# Patient Record
Sex: Female | Born: 1950 | Race: White | Hispanic: No | Smoking: Current every day smoker
Health system: Southern US, Community
[De-identification: ages and names within clinical notes are randomized; demographics above are authoritative.]

## PROBLEM LIST (undated history)

## (undated) DIAGNOSIS — R7303 Prediabetes: Secondary | ICD-10-CM

## (undated) DIAGNOSIS — I10 Essential (primary) hypertension: Secondary | ICD-10-CM

## (undated) DIAGNOSIS — J449 Chronic obstructive pulmonary disease, unspecified: Secondary | ICD-10-CM

## (undated) DIAGNOSIS — E669 Obesity, unspecified: Secondary | ICD-10-CM

## (undated) DIAGNOSIS — K2270 Barrett's esophagus without dysplasia: Secondary | ICD-10-CM

## (undated) DIAGNOSIS — F17200 Nicotine dependence, unspecified, uncomplicated: Secondary | ICD-10-CM

## (undated) DIAGNOSIS — K219 Gastro-esophageal reflux disease without esophagitis: Secondary | ICD-10-CM

## (undated) HISTORY — PX: OOPHORECTOMY: SHX86

## (undated) HISTORY — DX: Obesity, unspecified: E66.9

## (undated) HISTORY — DX: Gastro-esophageal reflux disease without esophagitis: K21.9

## (undated) HISTORY — PX: LAPAROSCOPY: SHX197

## (undated) HISTORY — DX: Prediabetes: R73.03

## (undated) HISTORY — PX: ABDOMINAL HYSTERECTOMY: SHX81

## (undated) HISTORY — DX: Essential (primary) hypertension: I10

## (undated) HISTORY — PX: CHOLECYSTECTOMY: SHX55

## (undated) HISTORY — DX: Chronic obstructive pulmonary disease, unspecified: J44.9

## (undated) HISTORY — DX: Nicotine dependence, unspecified, uncomplicated: F17.200

## (undated) HISTORY — DX: Barrett's esophagus without dysplasia: K22.70

---

## 1983-04-12 HISTORY — PX: APPENDECTOMY: SHX54

## 2004-11-08 ENCOUNTER — Emergency Department: Payer: Self-pay | Admitting: Emergency Medicine

## 2005-02-25 ENCOUNTER — Ambulatory Visit: Payer: Self-pay | Admitting: Gastroenterology

## 2006-01-02 ENCOUNTER — Observation Stay: Payer: Self-pay | Admitting: Internal Medicine

## 2006-03-09 ENCOUNTER — Ambulatory Visit: Payer: Self-pay | Admitting: Gastroenterology

## 2007-08-14 ENCOUNTER — Ambulatory Visit: Payer: Self-pay | Admitting: Family Medicine

## 2007-09-04 ENCOUNTER — Ambulatory Visit: Payer: Self-pay | Admitting: Unknown Physician Specialty

## 2008-03-10 ENCOUNTER — Ambulatory Visit: Payer: Self-pay

## 2008-03-10 ENCOUNTER — Encounter: Payer: Self-pay | Admitting: Internal Medicine

## 2008-05-23 ENCOUNTER — Ambulatory Visit: Payer: Self-pay | Admitting: Family Medicine

## 2008-05-30 ENCOUNTER — Ambulatory Visit: Payer: Self-pay | Admitting: Family Medicine

## 2008-06-10 ENCOUNTER — Ambulatory Visit: Payer: Self-pay | Admitting: Family Medicine

## 2009-08-07 ENCOUNTER — Encounter: Payer: Self-pay | Admitting: Internal Medicine

## 2009-08-07 ENCOUNTER — Ambulatory Visit: Payer: Self-pay | Admitting: Cardiology

## 2009-08-07 ENCOUNTER — Ambulatory Visit: Payer: Self-pay | Admitting: Family Medicine

## 2009-08-07 ENCOUNTER — Observation Stay (HOSPITAL_COMMUNITY): Admission: EM | Admit: 2009-08-07 | Discharge: 2009-08-08 | Payer: Self-pay | Admitting: Emergency Medicine

## 2009-08-11 ENCOUNTER — Encounter: Payer: Self-pay | Admitting: Internal Medicine

## 2009-08-11 ENCOUNTER — Ambulatory Visit: Payer: Self-pay | Admitting: Family Medicine

## 2009-08-13 ENCOUNTER — Encounter: Payer: Self-pay | Admitting: Cardiology

## 2009-08-19 ENCOUNTER — Telehealth (INDEPENDENT_AMBULATORY_CARE_PROVIDER_SITE_OTHER): Payer: Self-pay | Admitting: *Deleted

## 2009-08-24 DIAGNOSIS — E669 Obesity, unspecified: Secondary | ICD-10-CM

## 2009-08-24 DIAGNOSIS — I10 Essential (primary) hypertension: Secondary | ICD-10-CM | POA: Insufficient documentation

## 2009-09-02 ENCOUNTER — Encounter: Payer: Self-pay | Admitting: Cardiology

## 2009-09-02 DIAGNOSIS — R079 Chest pain, unspecified: Secondary | ICD-10-CM

## 2009-09-02 DIAGNOSIS — F17200 Nicotine dependence, unspecified, uncomplicated: Secondary | ICD-10-CM

## 2009-09-02 DIAGNOSIS — J449 Chronic obstructive pulmonary disease, unspecified: Secondary | ICD-10-CM

## 2009-09-02 DIAGNOSIS — J4489 Other specified chronic obstructive pulmonary disease: Secondary | ICD-10-CM | POA: Insufficient documentation

## 2009-09-02 DIAGNOSIS — K219 Gastro-esophageal reflux disease without esophagitis: Secondary | ICD-10-CM | POA: Insufficient documentation

## 2009-09-03 ENCOUNTER — Ambulatory Visit: Payer: Self-pay | Admitting: Cardiology

## 2009-09-29 ENCOUNTER — Ambulatory Visit: Payer: Self-pay | Admitting: Family Medicine

## 2009-09-29 ENCOUNTER — Encounter: Payer: Self-pay | Admitting: Internal Medicine

## 2009-09-29 LAB — CONVERTED CEMR LAB
ALT: 13 units/L
Albumin: 4.1 g/dL
BUN: 15 mg/dL
Hemoglobin: 13.1 g/dL
MCV: 78 fL
Platelets: 332 10*3/uL
TSH: 1.958 microintl units/mL

## 2009-10-07 ENCOUNTER — Telehealth: Payer: Self-pay | Admitting: Cardiology

## 2009-10-08 ENCOUNTER — Ambulatory Visit: Payer: Self-pay | Admitting: Internal Medicine

## 2009-10-08 ENCOUNTER — Encounter (INDEPENDENT_AMBULATORY_CARE_PROVIDER_SITE_OTHER): Payer: Self-pay | Admitting: *Deleted

## 2009-10-08 DIAGNOSIS — Z860100 Personal history of colon polyps, unspecified: Secondary | ICD-10-CM | POA: Insufficient documentation

## 2009-10-08 DIAGNOSIS — K2270 Barrett's esophagus without dysplasia: Secondary | ICD-10-CM

## 2009-10-08 DIAGNOSIS — R1319 Other dysphagia: Secondary | ICD-10-CM

## 2009-11-10 ENCOUNTER — Ambulatory Visit: Payer: Self-pay | Admitting: Internal Medicine

## 2010-05-11 NOTE — Letter (Signed)
Summary: [employer] Authorization   [employer] Authorization   Imported By: Roderic Ovens 09/18/2009 12:10:22  _____________________________________________________________________  External Attachment:    Type:   Image     Comment:   External Document

## 2010-05-11 NOTE — Assessment & Plan Note (Signed)
Summary: eph/jss   Visit Type:  post hospital Primary Provider:  Standley Dakins, MD  CC:  chest discomfort.  History of Present Illness: The patient is seen post hospitalization.  She was admitted August 07, 2009 and discharged August 08, 2009.  She had some chest discomfort.  There were some atypical qualities.  There was no myocardial infarction.  It was felt that she was stable for discharge with outpatient followup.  Since then she's done well.  Nexium appears to help her symptoms the most.  She continues to smoke but she is cutting back.  She has not had any recurrent significant chest discomfort.  Current Medications (verified): 1)  Aspirin 81 Mg Tbec (Aspirin) .... Take One Tablet By Mouth Daily 2)  Hydrochlorothiazide 25 Mg Tabs (Hydrochlorothiazide) .... Take One Tablet By Mouth Daily. 3)  Potassium Chloride Crys Cr 20 Meq Cr-Tabs (Potassium Chloride Crys Cr) .... Take One Tablet By Mouth Daily 4)  Advair Diskus 250-50 Mcg/dose Aepb (Fluticasone-Salmeterol) .... Once Daily 5)  Nexium 40 Mg Cpdr (Esomeprazole Magnesium) .... Two Times A Day 6)  Singulair 10 Mg Tabs (Montelukast Sodium) .... Once Daily 7)  Symbyax 3-25 Mg Caps (Olanzapine-Fluoxetine Hcl) .... Once Daily  Allergies (verified): 1)  ! Codeine  Past History:  Past Medical History: Last updated: 09/02/2009 HYPERTENSION, OVERWEIGHT/OBESITY COPD / asthma GERD / Barrett esophagus Depression Chest Pain   [facility]..08/07/2009.Marland Kitchenno ACS.Marland Kitchento have outpt. f/u Tobacco abuse     Review of Systems       Patient denies fever, chills, headache, sweats, rash, change in vision, change in hearing, chest pain, cough, nausea vomiting, urinary symptoms.  All of the systems are reviewed and are negative.  Vital Signs:  Patient profile:   60 year old female Height:      63 inches Weight:      216 pounds BMI:     38.40 Pulse rate:   88 / minute Pulse rhythm:   regular BP sitting:   124 / 78  (left arm)  Vitals Entered By:  Jacquelin Hawking, CMA (Sep 03, 2009 8:58 AM)  Physical Exam  General:  patient is stable today.  She is overweight. Head:  head is atraumatic. Eyes:  no xanthelasma. Neck:  no jugular venous distention. Chest Wall:  no chest wall tenderness. Lungs:  lungs reveal scattered rhonchi.  She does have an intermittent cough. Heart:  cardiac exam reveals S1-S2.  No clicks or significant murmurs. Abdomen:  abdomen is soft.  No masses or bruits. Msk:  no musculoskeletal deformities. Extremities:  no peripheral edema. Skin:  no skin rashes. Psych:  patient is oriented to person time and place.  Affect is normal.   Impression & Recommendations:  Problem # 1:  TOBACCO ABUSE (ICD-305.1) I had a long discussion with the patient about her smoking and encouraged her to stop.  She is cutting back.  She no longer smokes in her house and she tries not to smoke at all at work.  She is continuing to improve in this area.  Problem # 2:  CHEST PAIN (ICD-786.50)  Her updated medication list for this problem includes:    Aspirin 81 Mg Tbec (Aspirin) .Marland Kitchen... Take one tablet by mouth daily The patient has not had any recurring significant chest discomfort.  I have carefully reviewed her overall status.  I feel that we do not need to push for cardiac testing at this time.  She is definitely improved with treatment with Nexium.  EKG is done today and reviewed  by me.  It is normal.  I decided to plan to see her back on one other occasion.  If she has recurring symptoms we will be more aggressive with the evaluation.  Problem # 3:  COPD (ICD-496) The patient has a cough while in the room that is intermittent.  She is aware that stopping smoking is the key to her improvement.  Problem # 4:  HYPERTENSION, UNSPECIFIED (ICD-401.9) blood pressure is controlled today.  No change in therapy.  Other Orders: EKG w/ Interpretation (93000)  Patient Instructions: 1)  Follow up in 3 months

## 2010-05-11 NOTE — Letter (Signed)
Summary: EGD Instructions  [facility]  [address] [place], [place] [address]   Phone: [phone]  Fax: [phone]       Aimee Mcclure    07-Dec-1950    MRN: [id number]       Procedure Day Dorna Bloom: [name], 11/04/09     Arrival Time: 1:00 PM     Procedure Time: 2:00 PM     Location of Procedure:                    _X_ [facility] (4th Floor)  PREPARATION FOR ENDOSCOPY  On WEDNESDAY, 11/04/09 THE DAY OF THE PROCEDURE:  1.   No solid foods, milk or milk products are allowed after midnight the night before your procedure.  2.   Do not drink anything colored red or purple.  Avoid juices with pulp.  No orange juice.  3.  You may drink clear liquids until 12:00 PM, which is 2 hours before your procedure.                                                                                                CLEAR LIQUIDS INCLUDE: Water Jello Ice Popsicles Tea (sugar ok, no milk/cream) Powdered fruit flavored drinks Coffee (sugar ok, no milk/cream) Gatorade Juice: apple, white grape, white cranberry  Lemonade Clear bullion, consomm, broth Carbonated beverages (any kind) Strained chicken noodle soup Hard Candy   MEDICATION INSTRUCTIONS  Unless otherwise instructed, you should take regular prescription medications with a small sip of water as early as possible the morning of your procedure.                   OTHER INSTRUCTIONS  You will need a responsible adult at least 62 years of age to accompany you and drive you home.   This person must remain in the waiting room during your procedure.  Wear loose fitting clothing that is easily removed.  Leave jewelry and other valuables at home.  However, you may wish to bring a book to read or an iPod/MP3 player to listen to music as you wait for your procedure to start.  Remove all body piercing jewelry and leave at home.  Total time from sign-in until discharge is approximately 2-3  hours.  You should go home directly after your procedure and rest.  You can resume normal activities the day after your procedure.  The day of your procedure you should not:   Drive   Make legal decisions   Operate machinery   Drink alcohol   Return to work  You will receive specific instructions about eating, activities and medications before you leave.    The above instructions have been reviewed and explained to me by   [facility], CMA (AAMA)    I fully understand and can verbalize these instructions _____________________ Date October 08, 2009

## 2010-05-11 NOTE — Progress Notes (Signed)
  Recieved Attending Physicians Statement form [employer] sent to [facility]  Aug 19, 2009 9:59 AM

## 2010-05-11 NOTE — Miscellaneous (Signed)
  Clinical Lists Changes  Problems: Added new problem of COPD (ICD-496) Added new problem of GERD (ICD-530.81) Added new problem of * DEPRESSION Added new problem of CHEST PAIN (ICD-786.50) Added new problem of TOBACCO ABUSE (ICD-305.1) Observations: Added new observation of PAST MED HX: HYPERTENSION, OVERWEIGHT/OBESITY COPD / asthma GERD / Barrett esophagus Depression Chest Pain   [facility]..08/07/2009.Marland Kitchenno ACS.Marland Kitchento have outpt. f/u Tobacco abuse    (09/02/2009 7:55)       Past History:  Past Medical History: HYPERTENSION, OVERWEIGHT/OBESITY COPD / asthma GERD / Barrett esophagus Depression Chest Pain   [facility]..08/07/2009.Marland Kitchenno ACS.Marland Kitchento have outpt. f/u Tobacco abuse

## 2010-05-11 NOTE — Procedures (Signed)
Summary: Upper GI Endoscopy/[facility]  Upper GI Endoscopy/[facility]   Imported By: Sherian Rein 11/24/2009 15:21:21  _____________________________________________________________________  External Attachment:    Type:   Image     Comment:   External Document

## 2010-05-11 NOTE — Letter (Signed)
Summary: [facility]  [facility]   Imported By: Sherian Rein 11/23/2009 10:38:44  _____________________________________________________________________  External Attachment:    Type:   Image     Comment:   External Document

## 2010-05-11 NOTE — Letter (Signed)
Summary: [facility]  [facility]   Imported By: Sherian Rein 11/23/2009 10:37:39  _____________________________________________________________________  External Attachment:    Type:   Image     Comment:   External Document

## 2010-05-11 NOTE — Procedures (Signed)
Summary: Upper Endoscopy  Patient: Satoria Dunlop Note: All result statuses are Final unless otherwise noted.  Tests: (1) Upper Endoscopy (EGD)   EGD Upper Endoscopy       DONE     [facility]     [address]     [place], [place]  [address]           ENDOSCOPY PROCEDURE REPORT           PATIENT:  Ladeja, Pelham  MR#:  [id number]     BIRTHDATE:  [birth year], 59 yrs. old  GENDER:  female           ENDOSCOPIST:  Iva Boop, MD, [facility]     Referred by:  Standley Dakins, M.D.           PROCEDURE DATE:  11/10/2009     PROCEDURE:  EGD with biopsy, Maloney Dilation of Esophagus     ASA CLASS:  Class II     INDICATIONS:  dysphagia, h/o Barrett's Esophagus           MEDICATIONS:   Fentanyl 50 mcg IV, Versed 6 mg IV     TOPICAL ANESTHETIC:  Exactacain Spray           DESCRIPTION OF PROCEDURE:   After the risks benefits and     alternatives of the procedure were thoroughly explained, informed     consent was obtained.  The [facility] GIF-H180 [id number] endoscope was     introduced through the mouth and advanced to the second portion of     the duodenum, without limitations.  The instrument was slowly     withdrawn as the mucosa was fully examined.     <<PROCEDUREIMAGES>>           There were columnar mucosal changes consistent with Barrett's     esophagus identified at the gastroesophageal junction. Irregular     z-line vs. 2-3 tongues of columnar mucosa at GE junction (40 cm     from incisors). These possible tongues are not > 5 mm maximum     dimension. Multiple biopsies were obtained and sent to pathology.     Otherwise the examination was normal.    Retroflexed views revealed     no abnormalities.    The scope was then withdrawn from the patient     and 97 [place] Maloney dilator passed due to dysphagia.  No heme     seen.           COMPLICATIONS:  None           ENDOSCOPIC IMPRESSION:     1) Columnar mucosa, ? Barrett's at the gastroesophageal junction     - biopsied   2) Otherwise normal examination, 54 French Maloney dilator     passed without difficulty.     RECOMMENDATIONS:     Clear liquids until 4PM then soft diet. Normal diet tomorrow.     Await pathology - will notify.           Iva Boop, MD, [facility]           CC:  Standley Dakins MD and  The Patient           n.     eSIGNED:   Iva Boop at 11/10/2009 02:43 PM           Burnett Corrente, [phone]  Note: An exclamation mark (!) indicates a result that was not dispersed into the flowsheet. Document Creation  Date: 11/10/2009 2:43 PM _______________________________________________________________________  (1) Order result status: Final Collection or observation date-time: 11/10/2009 14:29 Requested date-time:  Receipt date-time:  Reported date-time:  Referring Physician:   Ordering Physician: Stan Head [phone]) Specimen Source:  Source: Launa Grill Order Number: [id number] Lab site:   Appended Document: Upper Endoscopy     Procedures Next Due Date:    EGD: 11/2014

## 2010-05-11 NOTE — Assessment & Plan Note (Signed)
Summary: BARRETTS ESOPHAGUS/YF   History of Present Illness Visit Type: Initial Consult Primary GI MD: Stan Head MD [facility] Primary Provider: Standley Dakins, MD Requesting Provider: Standley Dakins, MD Chief Complaint: Barrett's esophagus History of Present Illness:   Diagnosed with Barrett's esophagus in  02/2008 by Dr. Mechele Collin. Moved to [place] after that and needs follow-up care. We do not yet have endoscopy records. Food not going down right and gets upper abdominal pain at times. Two months of dysphagia.Not a new problem, has recurred. She believes she has had esophageal dilation in [place] and in [place]. She moved to [facility] in 2005 and saw Dr. Henrene Hawking and  had endoscopy x 1 due to abdominal painand dysphagia.Also having "alot of gas".  Was in ED and was admitted recently with epigastric pain radiating into chest. May 2011. Has seen Dr. Myrtis Ser and it is not thought to be cardiac.     GI Review of Systems    Reports bloating, dysphagia with liquids, and  dysphagia with solids.      Denies abdominal pain, acid reflux, belching, chest pain, heartburn, loss of appetite, nausea, vomiting, vomiting blood, weight loss, and  weight gain.      Reports constipation.     Denies anal fissure, black tarry stools, change in bowel habit, diarrhea, diverticulosis, fecal incontinence, heme positive stool, hemorrhoids, irritable bowel syndrome, jaundice, light color stool, liver problems, rectal bleeding, and  rectal pain. Preventive Screening-Counseling & Management  Alcohol-Tobacco     Alcohol drinks/day: 0     Smoking Status: current     Smoking Cessation Counseling: yes     Smoke Cessation Stage: ready     Packs/Day: 0.75     Year Started: 1969     Pack years: 0     Cigars/week: 00     Pipe use/week: 0     Passive Smoke Exposure: no     Tobacco Counseling: to quit use of tobacco products  Caffeine-Diet-Exercise     Caffeine use/day: 3-4 tea/day     Caffeine Counseling: decrease use of  caffeine     Diet Counseling: to improve diet; diet is suboptimal     Does Patient Exercise: yes     Type of exercise: stairs     Exercise Counseling: to improve exercise regimen  Comments: she is reducing tobacco she is hoping to lose weight and to change to brown rice, fruit and vegetable diet trying to walk stairs some    Current Medications (verified): 1)  Aspirin 81 Mg Tbec (Aspirin) .... Take One Tablet By Mouth Daily 2)  Hydrochlorothiazide 25 Mg Tabs (Hydrochlorothiazide) .... Take One Tablet By Mouth Daily. 3)  Potassium Chloride Crys Cr 20 Meq Cr-Tabs (Potassium Chloride Crys Cr) .... Take One Tablet By Mouth Daily 4)  Advair Diskus 250-50 Mcg/dose Aepb (Fluticasone-Salmeterol) .... Once Daily 5)  Nexium 40 Mg Cpdr (Esomeprazole Magnesium) .... Two Times A Day 6)  Singulair 10 Mg Tabs (Montelukast Sodium) .... Once Daily 7)  Symbyax 3-25 Mg Caps (Olanzapine-Fluoxetine Hcl) .... Once Daily 8)  Pravastatin Sodium 10 Mg Tabs (Pravastatin Sodium) .Marland Kitchen.. 1 Tablet By Mouth Once Daily 9)  Vitamin D (Ergocalciferol) 50000 Unit Caps (Ergocalciferol) .Marland Kitchen.. 1 By Mouth Twice Per Week  Allergies: 1)  ! Codeine 2)  ! Sulfa  Past History:  Past Medical History: HYPERTENSION, OVERWEIGHT/OBESITY COPD / asthma GERD / Barrett esophagus Depression Chest Pain   [facility]..08/07/2009.Marland Kitchenno ACS.Marland Kitchento have outpt. f/u Tobacco abuse  Adenomatous Colon Polyps Hyperlipidemia  Past Surgical History: Abdominal Hysterectomy-Total  Cholecystectomy Tonsillectomy Appendectomy  Family History: Reviewed history from 08/24/2009 and no changes required. mother died at age 69 and father at age 42. No Hx of CAD Family History of Stomach Cancer:father Leukemia-uncle Lung cancer-aunt brain cancer-aunt No FH of Colon Cancer:  Social History: Reviewed history from 08/24/2009 and no changes required. Full Time Single (divorced)   1 girl Tobacco Use - Yes.  less than 1 PPD Alcohol Use - no Regular  Exercise - no Drug Use - no Daily Caffeine Use Packs/Day:  0.75 Cigars/week:  00 Pack years:  0 Pipe use/week:  0 Passive Smoke Exposure:  no Alcohol drinks/day:  0 Caffeine use/day:  3-4 tea/day Does Patient Exercise:  yes  Review of Systems       The patient complains of allergy/sinus, cough, depression-new, fatigue, itching, shortness of breath, and swelling of feet/legs.         All other ROS negative except as per HPI.   Vital Signs:  Patient profile:   60 year old female Height:      63 inches Weight:      216.38 pounds BMI:     38.47 Pulse rate:   80 / minute Pulse rhythm:   regular BP sitting:   110 / 70  (left arm)  Vitals Entered By: Milford Cage NCMA (October 08, 2009 10:49 AM)  Physical Exam  General:  obese.  NAD Eyes:   no icterus. Mouth:  no lesions in oral or posterior pharnx Neck:  Supple; no masses or thyromegaly. Lungs:  Clear throughout to auscultation. Heart:  Regular rate and rhythm; no murmurs, rubs,  or bruits. Abdomen:  obese, soft and nontender without HSM/mass Extremities:  no edema Neurologic:  Alert and  oriented x4;  grossly normal neurologically. Cervical Nodes:  No significant cervical or supraclavicular adenopathy.  Psych:  Alert and cooperative. Normal mood and affect.  Office notes, labs, ekg from Dr. Standley Dakins were reviewed/flowed.  Impression & Recommendations:  Problem # 1:  DYSPHAGIA (ZOX-096.04) ? related to Symbyax vs.other - GERD., Barrett's related does have dry mouth do not see that rx causing esophageal problems has been on 6 mos and 2 mos sxs await records but with dysphagia needs an EGD and possible dilation.  Orders: EGD SAV (EGD SAV)  Problem # 2:  BARRETT'S ESOPHAGUS (ICD-530.85) dx 2009 await [facility] records  Problem # 3:  PERSONAL HX COLONIC POLYPS (ICD-V12.72) Assessment: Comment Only await records  Problem # 4:  OBESITY (ICD-278.00) Assessment: New we discussed need for qweight loss and  how that would help overall health and GI problems.  Patient Instructions: 1)  You need to lose weight. Start by limiting portions, amounts. Avoid eating when not hungry. Limit desserts.Look for high fructose corn syrup on food labels and if in first 3 ingredients, avoid that food. Also try to eat whole grains, avoid "white foods" (e.g. white rice, white bread).   2)  Reduce caffeine and quit smoking as discussed. 3)  [facility] Patient Information Guide given to patient.  4)  Upper Endoscopy with Dilatation brochure given.  5)  Copy sent to : Standley Dakins, MD 6)  The medication list was reviewed and reconciled.  All changed / newly prescribed medications were explained.  A complete medication list was provided to the patient / caregiver.  Appended Document: BARRETTS ESOPHAGUS/YF   Colonoscopy  Procedure date:  03/10/2008  Findings:      Multipe rectal polyps, largest 8mm ALL HYPERPLASTIC Internal hemorrhoids  Indication was  prior adenomatous polyps  EGD  Procedure date:  03/10/2008  Findings:      Hiatus hernia Barrett's esophagus Dudoenitis  Dr. Mechele Collin  Procedures Next Due Date:    Colonoscopy: 03/2013   Appended Document: BARRETTS ESOPHAGUS/YF needs colonoscopy recall 03/2013  Appended Document: BARRETTS ESOPHAGUS/YF Recall is in [facility]X for 03/2013.

## 2010-05-11 NOTE — Procedures (Signed)
Summary: Colonoscopy/[facility]  Colonoscopy/[facility]   Imported By: Sherian Rein 11/24/2009 15:22:29  _____________________________________________________________________  External Attachment:    Type:   Image     Comment:   External Document

## 2010-05-11 NOTE — Progress Notes (Signed)
Summary: disability forms  ---- Converted from flag ---- ---- 10/06/2009 12:04 PM, Bary Leriche wrote: 6/28 rec'd Dis. paperwork signed by Dr. Myrtis Ser, am sending it to [employer]. as directed pas. ------------------------------ noted Meredith Staggers, RN  October 07, 2009 8:46 AM

## 2010-05-11 NOTE — Letter (Signed)
Summary: [facility]  [facility]   Imported By: Sherian Rein 11/23/2009 10:31:30  _____________________________________________________________________  External Attachment:    Type:   Image     Comment:   External Document

## 2010-06-29 LAB — BASIC METABOLIC PANEL
BUN: 7 mg/dL (ref 6–23)
CO2: 31 mEq/L (ref 19–32)
Chloride: 98 mEq/L (ref 96–112)
Potassium: 3.3 mEq/L — ABNORMAL LOW (ref 3.5–5.1)
Sodium: 138 mEq/L (ref 135–145)

## 2010-06-29 LAB — POCT CARDIAC MARKERS
CKMB, poc: 1.8 ng/mL (ref 1.0–8.0)
Myoglobin, poc: 70.8 ng/mL (ref 12–200)
Troponin i, poc: <0.05 ng/mL (ref 0.00–0.09)

## 2010-06-29 LAB — CBC
HCT: 40.1 % (ref 36.0–46.0)
MCHC: 32.8 g/dL (ref 30.0–36.0)
RBC: 5.66 MIL/uL — ABNORMAL HIGH (ref 3.87–5.11)
RDW: 21 % — ABNORMAL HIGH (ref 11.5–15.5)
WBC: 9.2 10*3/uL (ref 4.0–10.5)

## 2010-06-29 LAB — COMPREHENSIVE METABOLIC PANEL
Chloride: 100 mEq/L (ref 96–112)
GFR calc Af Amer: >60 mL/min (ref >60)
Glucose, Bld: 102 mg/dL — ABNORMAL HIGH (ref 70–99)
Potassium: 3.2 mEq/L — ABNORMAL LOW (ref 3.5–5.1)
Total Protein: 6.8 g/dL (ref 6.0–8.3)

## 2010-06-29 LAB — POCT I-STAT 3, ART BLOOD GAS (G3+)
Bicarbonate: 34.9 mEq/L — ABNORMAL HIGH (ref 20.0–24.0)
O2 Saturation: 97 %
pH, Arterial: 7.439 — ABNORMAL HIGH (ref 7.350–7.400)
pO2, Arterial: 85 mmHg (ref 80.0–100.0)

## 2010-06-29 LAB — LIPID PANEL
Cholesterol: 193 mg/dL (ref 0–200)
Total CHOL/HDL Ratio: 6.4 RATIO
Triglycerides: 187 mg/dL — ABNORMAL HIGH (ref <150)

## 2010-06-29 LAB — HEMOGLOBIN A1C
Hgb A1c MFr Bld: 6.4 % — ABNORMAL HIGH (ref <5.7)
Mean Plasma Glucose: 137 mg/dL — ABNORMAL HIGH (ref <117)

## 2010-06-29 LAB — DIFFERENTIAL
Basophils Absolute: 0.1 10*3/uL (ref 0.0–0.1)
Basophils Relative: 1 % (ref 0–1)
Eosinophils Absolute: 0 10*3/uL (ref 0.0–0.7)
Lymphs Abs: 2.5 10*3/uL (ref 0.7–4.0)
Monocytes Absolute: 0.7 10*3/uL (ref 0.1–1.0)
Neutro Abs: 5.8 10*3/uL (ref 1.7–7.7)
Neutrophils Relative %: 64 % (ref 43–77)

## 2010-06-29 LAB — CARDIAC PANEL(CRET KIN+CKTOT+MB+TROPI)
CK, MB: 1.9 ng/mL (ref 0.3–4.0)
Relative Index: INVALID (ref 0.0–2.5)
Relative Index: INVALID (ref 0.0–2.5)
Total CK: 73 U/L (ref 7–177)
Troponin I: 0.04 ng/mL (ref 0.00–0.06)

## 2010-06-29 LAB — CK TOTAL AND CKMB (NOT AT ARMC): CK, MB: 1.8 ng/mL (ref 0.3–4.0)

## 2010-06-29 LAB — BRAIN NATRIURETIC PEPTIDE: Pro B Natriuretic peptide (BNP): <30 pg/mL (ref 0.0–100.0)

## 2010-06-29 LAB — TSH: TSH: 1.37 u[IU]/mL (ref 0.350–4.500)

## 2011-02-21 ENCOUNTER — Encounter: Payer: Self-pay | Admitting: Family Medicine

## 2011-02-22 ENCOUNTER — Ambulatory Visit (INDEPENDENT_AMBULATORY_CARE_PROVIDER_SITE_OTHER): Payer: Self-pay | Admitting: Medical

## 2011-02-22 ENCOUNTER — Encounter: Payer: Self-pay | Admitting: Medical

## 2011-02-22 VITALS — BP 130/80 | HR 72 | Temp 98.2°F | Resp 18 | Wt 237.5 lb

## 2011-02-22 DIAGNOSIS — K219 Gastro-esophageal reflux disease without esophagitis: Secondary | ICD-10-CM

## 2011-02-22 DIAGNOSIS — I10 Essential (primary) hypertension: Secondary | ICD-10-CM

## 2011-02-22 DIAGNOSIS — R062 Wheezing: Secondary | ICD-10-CM

## 2011-02-22 DIAGNOSIS — F17200 Nicotine dependence, unspecified, uncomplicated: Secondary | ICD-10-CM

## 2011-02-22 DIAGNOSIS — J441 Chronic obstructive pulmonary disease with (acute) exacerbation: Secondary | ICD-10-CM

## 2011-02-22 DIAGNOSIS — R0902 Hypoxemia: Secondary | ICD-10-CM | POA: Insufficient documentation

## 2011-02-22 MED ORDER — FLUTICASONE-SALMETEROL 250-50 MCG/DOSE IN AEPB: 1.0000 | INHALATION_SPRAY | Freq: Two times a day (BID) | RESPIRATORY_TRACT | Status: DC

## 2011-02-22 MED ORDER — AZITHROMYCIN 500 MG PO TABS: 500.0000 mg | ORAL_TABLET | Freq: Every day | ORAL | Status: AC

## 2011-02-22 MED ORDER — ALBUTEROL SULFATE HFA 108 (90 BASE) MCG/ACT IN AERS: 2.0000 | INHALATION_SPRAY | Freq: Four times a day (QID) | RESPIRATORY_TRACT | Status: DC | PRN

## 2011-02-22 MED ORDER — HYDROCHLOROTHIAZIDE 25 MG PO TABS: 25.0000 mg | ORAL_TABLET | Freq: Every day | ORAL | Status: DC

## 2011-02-22 MED ORDER — PREDNISONE 10 MG PO TABS: ORAL_TABLET | ORAL | Status: DC

## 2011-02-22 MED ORDER — OMEPRAZOLE 40 MG PO CPDR: 40.0000 mg | DELAYED_RELEASE_CAPSULE | Freq: Every day | ORAL | Status: DC

## 2011-02-22 NOTE — Progress Notes (Deleted)
  Subjective:    Patient ID: Aimee Mcclure, female    DOB: May 31, 1950, 60 y.o.   MRN: [id number]  HPI    Review of Systems     Objective:   Physical Exam        Assessment & Plan:

## 2011-02-22 NOTE — Progress Notes (Signed)
Subjective:   HPI  Aimee Mcclure is a 60 y.o. female who presents for general recheck.  Her last visit here was 09/2009.  This is my first visit with her today.  She is here for medication refills and general recheck on chronic issues.  She has hx/o COPD.  She was on Advair prior, but been out of this for close to a year.  She has been using Proair inhaler, but almost out of her last inhaler.  She was seeing another clinic briefly including [facility] in [place], and at some point was put on night time oxygen at 2L /min through [place].  She has been having worse shortness of breath for months, but in the last 2 weeks has had cough, congestion, productive sputum, wheezing, and not feeling well.  Denies fever or chills.  She does note palpations at times, and for months has been sleeping propped up on pillows at nighttime.  Can't lay flat.  She did get sent to the hospital from our clinic last year and had cardiac workup.  She is smoking a little less than 1 ppd.   She has hx/o hypertension, needs refill on HCTZ.  She notes hx/o GERD, needs refill on Omeprazole and says both work ok.    She notes family hx/o heart disease in MGF and MGM, father's side of family with cancers.    The following portions of the patient's history were reviewed and updated as appropriate: allergies, current medications, past family history, past medical history, past social history, past surgical history and problem list.  Past Medical History  Diagnosis Date  . COPD (chronic obstructive pulmonary disease)   . Prediabetes   . Barrett's esophagus   . Smoker   . GERD (gastroesophageal reflux disease)   . Hypertension   . Vitamin D deficiency    Review of Systems Constitutional: -fever, -chills, +sweats, -unexpected -weight change,+fatigue ENT: -runny nose, -ear pain, -sore throat Cardiology:  +chest pain, -palpitations, -edema Respiratory: -+Cough, +shortness of breath, +wheezing Gastroenterology: +abdominal  pain, -nausea, -vomiting, +diarrhea, -constipation Hematology: -bleeding or bruising problems Musculoskeletal: -arthralgias, -myalgias, -joint swelling, +back pain Ophthalmology: +vision changes Urology: -dysuria, -difficulty urinating, -hematuria, -urinary frequency, -urgency Neurology: +headache, +weakness, +tingling, +numbness     Objective:   Physical Exam  Filed Vitals:   02/22/11 1519  BP: 130/80  Pulse: 72  Temp: 98.2 F (36.8 C)  Resp: 18    General appearance: alert, no distress, WD/WN, white female, obese HEENT: normocephalic, sclerae anicteric, TMs pearly, nares patent, no discharge or erythema, pharynx normal Oral cavity: MMM, no lesions Neck: supple, no lymphadenopathy, no thyromegaly, no masses, no JVD Heart: RRR, normal S1, S2, no murmurs Lungs: decreased breath sounds, diffuse wheezes, no rhonchi, or rales Abdomen: +bs, soft, non tender, non distended, no masses, no hepatomegaly, no splenomegaly Pulses: 1+ symmetric Ext: no edema   Adult ECG Report  Indication: palpitations, SOB, wheezing  Rate: 88 bpm   Rhythm: normal sinus rhythm  QRS Axis: 6 degrees   PR Interval:  QRS Duration: 70ms  QTc: 403 ms  Conduction Disturbances: none  Other Abnormalities: T wave inversions V1, V2  Patient's cardiac risk factors are: hypertension, obesity (BMI >= 30 kg/m2), sedentary lifestyle and smoking/ tobacco exposure.  EKG comparison: 4/11 unchanged   Narrative Interpretation: no acute changes    Assessment and Plan :    Encounter Diagnoses  Name Primary?  Marland Kitchen COPD with acute exacerbation Yes  . GERD (gastroesophageal reflux disease)   . Essential  hypertension, benign   . Tobacco use disorder   . Hypoxia   . Wheezing    Upon presentation today she seemed tachypnea, and pulse ox was 85% room air.  Albuterol nebulized given and she did feel significantly improved, pulse ox improved to 88% room air.  Reviewed EKG and no significant changes from prior EKG  08/07/09. Reviewed 2011 hospital discharge summary regarding chest pain.  CXR today with no obvious CHF or pneumonia, however, there was lots of artifact due to developing solution malfunction that made xray reading difficult.  I recommended she consider going to the ED for hypoxia but she declines.  I suspect her baseline pulse ox is probably in the low 90s or worse although she is not sure.    For now, we will treat for acute COPD exacerbation.  Advised O2 at 2L/min 24/7, rest, prescriptions today for Prednisone Taper, Azithromycin, gave samples and refills on both Advair and Proair, and discussed Albuterol being rescue inhaler to use QID for now.  Recheck 3-5 days.  Refilled her other medications.   Strongly advised she stop tobacco and that if she continues smoking as she is currently, her life expectancy will be drastically shortened given her current exam, COPD state, and prior noncompliance.  She will call 1-800-QUIT-NOW for counseling and free medication to help stop smoking.

## 2011-02-22 NOTE — Patient Instructions (Signed)
Call 1-800-QUIT-NOW for free nicotine patches and to help STOP SMOKING.  Begin back on Advair twice daily.  Proair 4 times daily until I see you back.    Increase oxygen to 2 liters/min 24 hours daily until I see you back.  Lets recheck in 4-5 days.

## 2011-02-28 ENCOUNTER — Ambulatory Visit: Payer: Self-pay | Admitting: Medical

## 2011-05-09 ENCOUNTER — Institutional Professional Consult (permissible substitution): Payer: Self-pay | Admitting: Medical

## 2011-05-11 ENCOUNTER — Ambulatory Visit (INDEPENDENT_AMBULATORY_CARE_PROVIDER_SITE_OTHER): Payer: Self-pay | Admitting: Medical

## 2011-05-11 ENCOUNTER — Encounter: Payer: Self-pay | Admitting: Medical

## 2011-05-11 VITALS — BP 150/80 | HR 112 | Temp 98.4°F | Resp 20 | Wt 236.0 lb

## 2011-05-11 DIAGNOSIS — J441 Chronic obstructive pulmonary disease with (acute) exacerbation: Secondary | ICD-10-CM | POA: Insufficient documentation

## 2011-05-11 DIAGNOSIS — F17200 Nicotine dependence, unspecified, uncomplicated: Secondary | ICD-10-CM

## 2011-05-11 DIAGNOSIS — R0902 Hypoxemia: Secondary | ICD-10-CM

## 2011-05-11 MED ORDER — FLUTICASONE-SALMETEROL 250-50 MCG/DOSE IN AEPB: 1.0000 | INHALATION_SPRAY | Freq: Two times a day (BID) | RESPIRATORY_TRACT | Status: DC

## 2011-05-11 MED ORDER — ALBUTEROL SULFATE HFA 108 (90 BASE) MCG/ACT IN AERS: 2.0000 | INHALATION_SPRAY | Freq: Four times a day (QID) | RESPIRATORY_TRACT | Status: DC | PRN

## 2011-05-11 MED ORDER — MOXIFLOXACIN HCL 400 MG PO TABS: 400.0000 mg | ORAL_TABLET | Freq: Every day | ORAL | Status: AC

## 2011-05-11 MED ORDER — PREDNISONE 10 MG PO TABS: ORAL_TABLET | ORAL | Status: DC

## 2011-05-11 NOTE — Progress Notes (Signed)
Subjective:    Aimee Mcclure is a 61 y.o. female who presents for shortness of breath.  She has a hx/o COPD, last visit here in November for similar flare up. She notes increased SOB for the last 1 week.  She has run out of her Advair, and almost out of her proair rescue inhaler.  She has had earache, sneezing with some blood in the tissue and nosebleeds, coughing up sputum.  She is still on 2L/min O2 at nighttimes.   She does report orthopnea and at times palpitations and SOB during the night.   She has been using some Catering manager.  Given her lack of insurance, she tends to rely on samples of Advair.  While on Advair she does well, but as soon as she is out, she tends to be very limited due to shortness of breath.  She hasn't had a big flare since her last visit here. She says she quit tobacco yesterday and plans to use the Nicotine patch she has at home.   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    The following portions of the patient's history were reviewed and updated as appropriate: allergies, current medications, past family history, past medical history, past social history, past surgical history and problem list.  Past Medical History  Diagnosis Date  . COPD (chronic obstructive pulmonary disease)   . Prediabetes   . Barrett's esophagus   . Smoker   . GERD (gastroesophageal reflux disease)   . Hypertension   . Vitamin D deficiency    Review of Systems Constitutional: denies fever, chills, sweats, unexpected weight change, anorexia, +fatigue ENT: + ear pain, sore throat, hoarseness, sinus pain,+ epistaxis Cardiology: +palpitations, no edema, +orthopnea, +paroxysmal nocturnal dyspnea Respiratory: +cough, shortness of breath, dyspnea on exertion, wheezing, no hemoptysis Gastroenterology: denies abdominal pain, nausea, vomiting, diarrhea, constipation,     Objective:     Filed Vitals:   05/11/11 1356  BP: 150/80  Pulse: 112  Temp: 98.4 F (36.9 C)  Resp: 20    General  appearance: alert, white female , difficulty with talking, in somewhat of respiratory distress HEENT: normocephalic, conjunctiva/corneas normal, sclerae anicteric, mild erythema of TMs, nares patent, no discharge or erythema, pharynx normal Oral cavity: lips upon presentation faintly cyanotic, MMM, tongue normal Neck: supple, no lymphadenopathy, no thyromegaly, no JVD  Heart: RRR, normal S1, S2, no murmurs Lungs: globally decreased breath sounds, diffuse wheezes, + rhonchi, but no rales Extremities: no edema, no cyanosis, no clubbing Pulses: 2+ symmetric, upper and lower extremities, normal cap refill    Assessment:   Encounter Diagnoses  Name Primary?  Marland Kitchen COPD exacerbation Yes  . Hypoxia   . Current smoker     Plan   Upon presentation with pulse ox of 79%, after quick history and exam, started her on 1 round of nebulizer and 2L/min oxygen.  She felt some improvement, pulse ox improved to 93%.  We then repeated another round of albuterol nebulized.  She had moderate improvement, again 93-95% pulse ox.    Discussed her worsening COPD.  Samples today of Advair, albuterol inhaler, Avelox antibiotic, and script today for Prednisone taper.  Advised she really need to abstain from tobacco.  She will bregin nicotine patches.  She will increase oxygen to 24/7 the next few days at 2L/min, rest, begin back on Advair, begin prednisone and Avelox.  Signed her medication assistance forms and she is looking into disability since she is significantly limited by her COPD.  She will call report here Friday.  She will go to the ED if worse or not improving.   We will also set her up for spirometry.

## 2011-05-11 NOTE — Patient Instructions (Addendum)
You current have a COPD flare up.  You walked in with an oxygen saturation of 79% today!  Your baseline is probably around low 90%.  For the next few days, use your home oxygen at 2L/min 24/7 round the clock.  I gave samples of Advair and albuterol today.  Restart Advair twice daily, use the albuterol inhaler 4 times daily for the next several days. Begin Avelox antibiotic, once daily for a week, and begin Prednisone taper.  I sent the prednisone to the pharmacy.    I will call your pharmacy about other low cost medication options.  I would encourage you to pursue disability.    Continue efforts to use the Nicotine patch and refrain from tobacco use.   I want you to call here Friday to let me know how your are doing.   If much worse, go to the hospital.

## 2011-05-12 ENCOUNTER — Other Ambulatory Visit: Payer: Self-pay | Admitting: Medical

## 2011-05-12 MED ORDER — BUDESONIDE 1 MG/2ML IN SUSP: 1.0000 mg | Freq: Two times a day (BID) | RESPIRATORY_TRACT | Status: DC

## 2011-05-12 MED ORDER — ALBUTEROL SULFATE (2.5 MG/3ML) 0.083% IN NEBU: 2.5000 mg | INHALATION_SOLUTION | Freq: Four times a day (QID) | RESPIRATORY_TRACT | Status: DC | PRN

## 2011-05-13 ENCOUNTER — Telehealth: Payer: Self-pay | Admitting: Family Medicine

## 2011-05-13 NOTE — Telephone Encounter (Signed)
Aimee Mcclure the cost of the Pulmicort nebulizer solution $808.99. I called her pharmacy which is [facility]. I hadn't called the patient yet because of this price. Let me know what you want me to do. CLS

## 2011-05-13 NOTE — Telephone Encounter (Signed)
Message copied by Janeice Robinson on Fri May 13, 2011  4:05 PM ------      Message from: Jac Canavan      Created: Fri May 13, 2011  8:13 AM       pls call her pharmacy to ask how much the Pulmicort nebulizer fluid costs cash pay.  I sent this as a back up inhaled steroid as an alternative for Advair.              Let her know the cost of this (and me too).  Let her know I sent this to the pharmacy as a back up when/if she runs out of Advair.

## 2011-05-14 NOTE — Telephone Encounter (Signed)
Call back and see how she is doing.  Is her breathing better.  Let her know I sent script for pulmicort to pharmacy, but ignore this as the price was way more expense that I had realized. Just c/t Advair, and let me know before she gets close to running out.

## 2011-05-16 NOTE — Telephone Encounter (Signed)
SHANE PATIENT STATES THAT SHE IS DOING MUCH BETTER. I TOLD HER ABOUT THE MEDICATION AND I ALSO WAS TELLING HER ABOUT HER APPOINTMENT FOR A PFT. PATIENT STATES THAT SHE WOULD LIKE TO HOLD OFF ON THIS FOR NOW UNTIL SHE HEARS BACK FROM DISABILITY BECAUSE SHE DOESN'T HAVE ANY INSURANCE RIGHT NOW. CLS

## 2011-05-16 NOTE — Telephone Encounter (Signed)
Lmom for the patient to CB. CLS

## 2011-05-16 NOTE — Telephone Encounter (Signed)
pls call back and let her know that I sent pulmicort nebulized to pharmacy which is like an Advair in nebulized form.  The pharmacy said this was over $800, but the drug rep today told me that it should cost around $150 per month.  I know this is still expensive, but it is an option over advair.  For now, I'll try to keep her supplied with Advair or similar.  She just needs to let me know before running out.

## 2011-05-25 ENCOUNTER — Encounter (HOSPITAL_COMMUNITY): Payer: Self-pay

## 2011-06-01 ENCOUNTER — Telehealth: Payer: Self-pay | Admitting: Internal Medicine

## 2011-06-01 NOTE — Telephone Encounter (Signed)
Will provide pt with 1 of each and pt will come pick up 2/21

## 2011-06-08 ENCOUNTER — Telehealth: Payer: Self-pay | Admitting: Family Medicine

## 2011-06-08 NOTE — Telephone Encounter (Signed)
DONE

## 2011-07-11 ENCOUNTER — Telehealth: Payer: Self-pay | Admitting: Medical

## 2011-07-12 NOTE — Telephone Encounter (Signed)
Samples of Advair ready to pickup.

## 2011-07-12 NOTE — Telephone Encounter (Signed)
Patient was notified that her samples was ready. CLS

## 2011-07-15 ENCOUNTER — Telehealth: Payer: Self-pay | Admitting: Internal Medicine

## 2011-07-15 NOTE — Telephone Encounter (Signed)
We really need a recheck OV at this time on the breathing.    How did her test go last week - breathing test and disability physical?

## 2011-07-19 NOTE — Telephone Encounter (Signed)
Patient states that she she will have them to fax over information about her breathing test and the physical. She said she was not good last week. She was having a lot of back spasms. I informed her that she will need to come in for a recheck on her breathing. She states that she is unable to come in until June because she has all this stuff she needs to pay for first. CLS

## 2011-08-02 ENCOUNTER — Encounter: Payer: Self-pay | Admitting: Medical

## 2011-08-02 ENCOUNTER — Ambulatory Visit: Payer: Self-pay | Admitting: Medical

## 2011-08-02 ENCOUNTER — Ambulatory Visit (INDEPENDENT_AMBULATORY_CARE_PROVIDER_SITE_OTHER): Payer: Self-pay | Admitting: Medical

## 2011-08-02 VITALS — BP 130/58 | HR 92 | Temp 98.2°F | Resp 20 | Wt 255.0 lb

## 2011-08-02 DIAGNOSIS — J309 Allergic rhinitis, unspecified: Secondary | ICD-10-CM | POA: Insufficient documentation

## 2011-08-02 DIAGNOSIS — J329 Chronic sinusitis, unspecified: Secondary | ICD-10-CM

## 2011-08-02 DIAGNOSIS — J449 Chronic obstructive pulmonary disease, unspecified: Secondary | ICD-10-CM

## 2011-08-02 DIAGNOSIS — R609 Edema, unspecified: Secondary | ICD-10-CM

## 2011-08-02 DIAGNOSIS — R0602 Shortness of breath: Secondary | ICD-10-CM

## 2011-08-02 LAB — CBC WITH DIFFERENTIAL/PLATELET
Basophils Absolute: 0 10*3/uL (ref 0.0–0.1)
Eosinophils Relative: 2 % (ref 0–5)
HCT: 42.2 % (ref 36.0–46.0)
Lymphocytes Relative: 20 % (ref 12–46)
Lymphs Abs: 1.6 10*3/uL (ref 0.7–4.0)
MCH: 19.7 pg — ABNORMAL LOW (ref 26.0–34.0)
MCV: 73 fL — ABNORMAL LOW (ref 78.0–100.0)
Monocytes Absolute: 0.7 10*3/uL (ref 0.1–1.0)
RDW: 18.8 % — ABNORMAL HIGH (ref 11.5–15.5)
WBC: 7.9 10*3/uL (ref 4.0–10.5)

## 2011-08-02 LAB — COMPREHENSIVE METABOLIC PANEL
AST: 14 U/L (ref 0–37)
Alkaline Phosphatase: 82 U/L (ref 39–117)
BUN: 13 mg/dL (ref 6–23)
Calcium: 8.6 mg/dL (ref 8.4–10.5)
Chloride: 94 mEq/L — ABNORMAL LOW (ref 96–112)
Creat: 0.55 mg/dL (ref 0.50–1.10)

## 2011-08-02 MED ORDER — CETIRIZINE HCL 10 MG PO TABS: 10.0000 mg | ORAL_TABLET | Freq: Every day | ORAL | Status: AC

## 2011-08-02 MED ORDER — FUROSEMIDE 20 MG PO TABS: 20.0000 mg | ORAL_TABLET | Freq: Every day | ORAL | Status: DC

## 2011-08-02 MED ORDER — AMOXICILLIN 875 MG PO TABS: 875.0000 mg | ORAL_TABLET | Freq: Two times a day (BID) | ORAL | Status: AC

## 2011-08-02 NOTE — Progress Notes (Signed)
Subjective: Here for general recheck.   She went recently for PFTs and exam through disability to try and get disability determination.    recently hired cleaning lady to come in and clean as she can't due to her breathing.  This stirred up dust and aggravated her breathing.  She changed air filter which was caked with dust and debris.  Friday awoke with eye swollen, thinks she has sinus infection now.  Has ears aching, sinus pressure, runny nose and sneezing, itching eyes.  Ran out of Zyrtec, started using Tylenol sinus which helps.  Not having bags under eyes as bad currently.    She quit smoking 07/23/11.  Had bad spell of not being able to breath at grocery store the other day, decided to stop smoking that day.  Still using tank at home.  Wants portable O2 for mobility. Uses [facility].    Swelling in legs for weeks now, can't lie flat, but has had dyspnea with reclining or lying flat for moths.  The swelling is new though.  No hx/o heart problems but did have heart eval in [place] few years ago  No other c/o.  The following portions of the patient's history were reviewed and updated as appropriate: allergies, current medications, past family history, past medical history, past social history, past surgical history and problem list.  Past Medical History  Diagnosis Date  . COPD (chronic obstructive pulmonary disease)   . Prediabetes   . Barrett's esophagus   . Smoker   . GERD (gastroesophageal reflux disease)   . Hypertension   . Vitamin d deficiency     Allergies  Allergen Reactions  . Codeine   . Sulfonamide Derivatives      Review of Systems Gen: no fever, chills, +weight gain, +fatigue HEENT: +sinus pressure, allergy symptoms GI: negative GU; negative Heart: no CP, palpitation ROS reviewed and was negative other than noted in HPI or above.    Objective:   Physical Exam  General appearance: alert, no distress, WD/WN, obese white female, not as distressed appearing as in  the past visits HEENT: normocephalic, sclerae anicteric, +sinus tenderness, TMs pearly, nares patent, no discharge or erythema, pharynx normal Oral cavity: MMM, no lesions Neck: supple, no lymphadenopathy, no thyromegaly, no masses, no JVD Heart: RRR, normal S1, S2, no murmurs Lungs: scattered wheezes, no rhonchi, or rales Abdomen: +bs, soft, non tender, non distended, no masses, no hepatomegaly, no splenomegaly Pulses: 1+ symmetric, upper and lower extremities Ext: 2+ edema bilat LE  Assessment and Plan :     Encounter Diagnoses  Name Primary?  . Shortness of breath Yes  . Edema   . COPD (chronic obstructive pulmonary disease)   . Sinus infection   . Allergic rhinitis    COPD/SOB - we will call [facility] to bring out portable O2 in addition to her home tank O2.  C/t Advair, hopefully she will continue to refrain from smoking.  Will request prior cardiac records from [place].   Edema - labs today to help further eval.   Begin Lasix 20mg  daily, and recheck weight here with nurse in 1 wk.  Sinus infection- begin Amoxicillin  Allergi rhinitis - begin back on Zyrtec  F/u pending labs. Of note, she is awaiting call back from [employer] about disability determination.

## 2011-08-02 NOTE — Patient Instructions (Signed)
COPD/ shortness of breath - continue Advair, samples given today.  We will call and try to arrange portable oxygen as well.  Sinus infection - begin Amoxicillin antibiotic, twice daily x 10 days  Allergies - restart Zyrtec 10mg  daily at bedtime  Swelling - begin Lasix 20mg  daily for swelling, elevated legs, and avoid lying flat.  Recheck your weight here in 1 week.

## 2011-08-05 ENCOUNTER — Telehealth: Payer: Self-pay | Admitting: Medical

## 2011-08-11 NOTE — Telephone Encounter (Signed)
TSD  

## 2011-08-30 ENCOUNTER — Telehealth: Payer: Self-pay | Admitting: Family Medicine

## 2011-08-30 NOTE — Telephone Encounter (Signed)
What did the disability office say?  What was the reasoning?    How is her lung function doing?

## 2011-08-30 NOTE — Telephone Encounter (Signed)
PATIENT CAME BY FOR A WEIGHT CHECK. HER WEIGHT IS 246 LBS. CLS SHE ALSO SAID THAT SHE DID NOT GET MEDICAID. CLS

## 2011-08-31 ENCOUNTER — Telehealth: Payer: Self-pay | Admitting: Family Medicine

## 2011-08-31 NOTE — Telephone Encounter (Signed)
The note is confusing.  If she qualifies for SSI/social security disability, then she should be able to get Medicare disability now or Medicaid disability now.  I'm not a social services expert so I'm not sure which, but I thinks she does qualify.    So if she was declined disability and insurance through [employer] (medicaid/medicare), then I would recommend she go see a pulmonologist, who is an expert in lung function, and they can give there assessment and recommendations about how bad her lung function is, and this may help qualify her.    Its hard for me to believe she doesn't qualify now to receive SSI/disability insurance coverage through the government.   Thus, refer to Dr. Ocie Doyne, after she has seen him, I would push the qualification issue further.    FYI - see if Lafonda Mosses or Efraim Kaufmann can help with this one.

## 2011-08-31 NOTE — Telephone Encounter (Signed)
PATIENT DID GET ON DISABILITY. MEDICAID SAID SHE DIDN'T QUALIFY. THEY SAID THEY WOULD DO EMERGENCY MEDICAID IF SHE WAS EVER ADMITTED TO THE HOSPITAL BUT NOT THROUGH THE ER. SHE SAID SHE WILL GET MEDICARE IN 2 YEARS. CLS

## 2011-08-31 NOTE — Telephone Encounter (Signed)
SHANE MY NOTE SAID MEDICAID NOT DISABILITY. CLS 

## 2011-08-31 NOTE — Telephone Encounter (Signed)
I JUST SPOKE BACK WITH THE PATIENT AND SHE SAID THAT SHE HAD TO BE ON DISABILITY FOR 2 YEARS BEFORE THEY CAN APPROVE HER MEDICARE. I ALSO TOLD HER ABOUT THE REFERRAL TO THE PULMO. DOCTOR AND SHE SAID SHE DOESN'T BELIEVE THAT THIS IS GOING TO WORK. SHE STATES THAT THEY GAVE HER A LIST OF NUMBERS TO CALL TO HELP HER OUT WITH HER MEDICATION AND HER OXYGEN. AGAIN, SHE WAS DECLINED FOR MEDICAID BECAUSE SHE DOES NOT QUALIFY. CLS

## 2011-08-31 NOTE — Telephone Encounter (Signed)
Aimee Mcclure MY NOTE SAID MEDICAID NOT DISABILITY. CLS

## 2011-09-06 ENCOUNTER — Telehealth: Payer: Self-pay | Admitting: Family Medicine

## 2011-09-06 NOTE — Telephone Encounter (Signed)
Aimee Mcclure I reviewed the notes from you and Karren Burly.  I also reviewed Medicare disability benefits and she will not qualify for Medicare until 24 months after received disability benefits.  She can however, get emergency Medicaid if you admit her to the hospital.  Please advise further.

## 2011-09-07 NOTE — Telephone Encounter (Signed)
Maybe we can talk about this in person, so you can educate me more on how a patient goes through this process.    Thanks for looking into it.

## 2011-09-08 ENCOUNTER — Telehealth: Payer: Self-pay | Admitting: Medical

## 2011-09-08 NOTE — Telephone Encounter (Signed)
Please call concerning setting her up to receive oxygen.  She has been calling around to find someone to "work with her", she states Christoper Allegra will not work with her anymore. Someone she spoke to told her that the hospitals have an "emergency fund" and she should be able to get some help thru that but she doesn't have any information about who to contact about it.

## 2011-09-09 NOTE — Telephone Encounter (Signed)
pls check with [facility] or [facility] about oxygen supplies.  I recommend referral to Dr. Wert/pulmonology or needs recheck OV here at this time.

## 2011-09-09 NOTE — Telephone Encounter (Signed)
Pull chart

## 2011-09-09 NOTE — Telephone Encounter (Signed)
PATIENTS CHART IS IN YOUR OFFICE. CLS 

## 2011-09-13 NOTE — Telephone Encounter (Signed)
SHANE I CALLED [facility] THEY SAID THAT IT WOULD COST HER $150.00 DOLLARS A MONTH. I ALSO CALLED [facility] AND THEY SAID THAT IT WOULD COST HER $ 150.00 DOLLARS A  MONTH PLUS $15.00 DOLLARS A  MONTH FOR THE PORTABLE OXYGEN. I CALL AND I SPOKE WITH THE PATIENT TO LET HER KNOW THIS INFORMATION AND TO LET HER KNOW ABOUT EITHER SEEING DR. Sherene Sires OR F/U HERE. SHE SAID SHE WILL COME HERE BECAUSE SHE DOESN'T KNOW THE COST TO SEE DR. Sherene Sires. SHE ALSO SAID THAT YOU DIDN'T WANT TO SEE HER UNTIL July 31ST FOR SHE WILL NOT BE IN UNTIL THEN. SHE ALSO SAID SOMETHING ABOUT THE HOSPITAL DELIVERING OXYGEN TO HER HOME I'M NOT SURE ABOUT THAT. DO YOU HAVE ANY IDEA? CLS

## 2011-09-13 NOTE — Telephone Encounter (Signed)
See me about this.  

## 2011-09-14 NOTE — Telephone Encounter (Signed)
I CALLED AND SPOKE TO THE PATIENT THAT I SPOKE WITH SOMEONE AT [facility] IN REFERENCE TO HER OXYGEN AND SOME KIND OF DISCOUNT AND THE LADY SAID THAT SHE WOULD FAX ME OVER SOME FORMS FOR HER TO FILL IT TO SEE IF SHE QUALIFY FOR ANY ASSISTANCE. I TOLD HER THAT I WOULD MAIL HER A COPY. CLS

## 2011-10-02 ENCOUNTER — Other Ambulatory Visit: Payer: Self-pay | Admitting: Medical

## 2011-10-03 NOTE — Telephone Encounter (Signed)
RX REFILL 

## 2011-10-04 ENCOUNTER — Telehealth: Payer: Self-pay | Admitting: Internal Medicine

## 2011-10-04 MED ORDER — FLUTICASONE-SALMETEROL 250-50 MCG/DOSE IN AEPB: 1.0000 | INHALATION_SPRAY | Freq: Two times a day (BID) | RESPIRATORY_TRACT | Status: DC

## 2011-10-04 NOTE — Telephone Encounter (Signed)
Pt friend will come pick up her advair 250/50 for her

## 2011-11-01 ENCOUNTER — Telehealth: Payer: Self-pay | Admitting: Medical

## 2011-11-04 ENCOUNTER — Telehealth: Payer: Self-pay | Admitting: Medical

## 2011-11-04 NOTE — Telephone Encounter (Signed)
PLS GIVE BOTH RX'S FOR OXYGEN & VENTOLIN TO Vernona Rieger

## 2011-11-04 NOTE — Telephone Encounter (Signed)
PLS GIVE RX TO Chriselda Leppert-LM

## 2011-11-07 NOTE — Telephone Encounter (Signed)
LM

## 2011-12-03 ENCOUNTER — Other Ambulatory Visit: Payer: Self-pay | Admitting: Medical

## 2011-12-05 NOTE — Telephone Encounter (Signed)
Patient needs a office visit to follow up on her blood pressure before her medication runs out.

## 2011-12-15 ENCOUNTER — Other Ambulatory Visit: Payer: Self-pay | Admitting: Medical

## 2011-12-15 NOTE — Telephone Encounter (Signed)
RX refill for Lasix.

## 2011-12-23 ENCOUNTER — Encounter: Payer: Self-pay | Admitting: Medical

## 2011-12-23 ENCOUNTER — Ambulatory Visit (INDEPENDENT_AMBULATORY_CARE_PROVIDER_SITE_OTHER): Payer: Self-pay | Admitting: Medical

## 2011-12-23 VITALS — BP 128/68 | HR 92 | Temp 98.1°F | Resp 20 | Wt 238.0 lb

## 2011-12-23 DIAGNOSIS — R109 Unspecified abdominal pain: Secondary | ICD-10-CM

## 2011-12-23 DIAGNOSIS — J40 Bronchitis, not specified as acute or chronic: Secondary | ICD-10-CM

## 2011-12-23 DIAGNOSIS — R198 Other specified symptoms and signs involving the digestive system and abdomen: Secondary | ICD-10-CM

## 2011-12-23 DIAGNOSIS — J449 Chronic obstructive pulmonary disease, unspecified: Secondary | ICD-10-CM

## 2011-12-23 DIAGNOSIS — R194 Change in bowel habit: Secondary | ICD-10-CM

## 2011-12-23 MED ORDER — AMOXICILLIN-POT CLAVULANATE 875-125 MG PO TABS: 1.0000 | ORAL_TABLET | Freq: Two times a day (BID) | ORAL | Status: AC

## 2011-12-23 NOTE — Patient Instructions (Signed)
Rest, consider just drinking clear fluids the next 24 hours, and avoiding solid food for 1-2 days.  Gradually bring back diet to bland and easily digested food like bananas, rice, applesauce, toast, jello, broth.   Then gradually bring diet back to normal as you feel improved.  Begin Miralax powder, 1 cap full daily for bowel function.    Begin Augmentin antibiotic.    If not much improved in 7-10 days, let us know.    Bring the stool cards back so we can check for blood.

## 2011-12-23 NOTE — Progress Notes (Signed)
Subjective: Here for multiple c/o.   symptoms somewhat vague and hard to follow today.  Nevertheless, after careful discussion, symptoms mainly are for possible bronchitis and abdominal pain.  Hx/o COPD, but in the last week or so worsening productive cough, worse breathing than usual, chest congestion, worried about infection.  Still smokes . Uses oxygen primarily QHS but occasional random daily use of oxygen.  Here for abdominal pain.  She notes left upper quadrant pain in the last week or so.  Hx/o months of abdominal pain in general including frequent loose stools, sometimes thin somewhat solid stools, yellowish color, horrible gas.  Uses Prevacid on most days.  Gets hot and sweaty at times .  Last normal BM months ago.  She also notes hx/o diverticulitis.  Denies  Hx/o pancreatitis.  She has hx/o EGD and colnoscopy, but probably due for colonoscopy at this time but can't afford it.  She used some dulcolax recently for some constipation.    Past Medical History  Diagnosis Date  . COPD (chronic obstructive pulmonary disease)   . Prediabetes   . Barrett's esophagus   . Smoker   . GERD (gastroesophageal reflux disease)   . Hypertension   . Vitamin d deficiency    Past Surgical History  Procedure Date  . Abdominal hysterectomy     partial  . Oophorectomy   . Laparoscopy     abdomen/pelvis  . Cholecystectomy     ROS as in HPI above.  Objective: Gen: wd, wn, obese white female Heent: sinus nontender, TMs pearly, nares patent, pharynx normal Oral: MMM Lungs - poor breath sounds as usual, no specific rhonchi or rales, scattered wheezes present Heart: RRR, normal s1, s2, no murmurs GI: +bs, dullness to percussion over LUQ, tender right and left pelvis/abdomen, but no organomegaly , no mass, no rebound Pulse Normal   Assessment:  Encounter Diagnoses  Name Primary?  . Abdominal pain Yes  . Bronchitis   . COPD (chronic obstructive pulmonary disease)   . Change in stool habits     Plan: Abdominal pain - etiology unclear.  Possible bowel infection vs gastroenteritis vs pancreatitis.  She declines labs and additional evaluation today.  Advised clear fluids by mouth next 24 hours, then gradually increase to [employer]  Call report 2-3 days.  bronchitis - begin Augmentin, rest, hydrate well, call/return if not improving or worse  COPD - c/t Advair, QHS oxygen, and prn use of day time oxygen.  Advised the need to completely stop tobacco  Change in stool - she will take home stool cards to return for hemoccult testing.  Discussed need for repeat colonoscopy.

## 2012-01-24 ENCOUNTER — Other Ambulatory Visit: Payer: Self-pay | Admitting: Medical

## 2012-01-24 NOTE — Telephone Encounter (Signed)
Patient needs an appointment to follow up on blood pressure

## 2012-02-22 ENCOUNTER — Telehealth: Payer: Self-pay | Admitting: Internal Medicine

## 2012-02-22 MED ORDER — HYDROCHLOROTHIAZIDE 25 MG PO TABS: 25.0000 mg | ORAL_TABLET | Freq: Every day | ORAL | Status: DC

## 2012-02-22 NOTE — Telephone Encounter (Signed)
PATIENT RX REFILL WAS SENT TO THE PHARMACY FOR ONLY 30 DAYS BECAUSE SHE NEEDS A OFFICE VISIT. CLS

## 2012-03-01 ENCOUNTER — Encounter: Payer: Self-pay | Admitting: Internal Medicine

## 2012-03-21 ENCOUNTER — Encounter: Payer: Self-pay | Admitting: Medical

## 2012-03-21 ENCOUNTER — Ambulatory Visit (INDEPENDENT_AMBULATORY_CARE_PROVIDER_SITE_OTHER): Payer: Self-pay | Admitting: Medical

## 2012-03-21 VITALS — BP 110/80 | HR 88 | Temp 98.2°F | Resp 20 | Wt 234.0 lb

## 2012-03-21 DIAGNOSIS — J449 Chronic obstructive pulmonary disease, unspecified: Secondary | ICD-10-CM

## 2012-03-21 DIAGNOSIS — R0602 Shortness of breath: Secondary | ICD-10-CM

## 2012-03-21 DIAGNOSIS — R0902 Hypoxemia: Secondary | ICD-10-CM

## 2012-03-21 MED ORDER — FUROSEMIDE 20 MG PO TABS: 20.0000 mg | ORAL_TABLET | Freq: Every day | ORAL | Status: DC

## 2012-03-21 MED ORDER — PREDNISONE 10 MG PO TABS: ORAL_TABLET | ORAL | Status: DC

## 2012-03-21 MED ORDER — POTASSIUM CHLORIDE ER 10 MEQ PO CPCR: 10.0000 meq | ORAL_CAPSULE | Freq: Two times a day (BID) | ORAL | Status: DC

## 2012-03-21 MED ORDER — LEVOFLOXACIN 500 MG PO TABS: 500.0000 mg | ORAL_TABLET | Freq: Every day | ORAL | Status: DC

## 2012-03-21 MED ORDER — ALBUTEROL SULFATE (2.5 MG/3ML) 0.083% IN NEBU: 2.5000 mg | INHALATION_SOLUTION | Freq: Four times a day (QID) | RESPIRATORY_TRACT | Status: DC | PRN

## 2012-03-21 NOTE — Progress Notes (Signed)
Subjective: Aimee Mcclure is a 61yo white female with a hx/o COPD, hypoxia, smoker here for shortness of breath and possible flu.  Granddaughter was diagnosed with influenza around thanksgiving, and she came down with flu like symptoms on 03/10/12, aches, cough, runny nose, congestion, chills.  Most of the past week couldn't talk much due to shortness of breath.  A lot of the flu symptoms got better by end of last week, but currently she reports bad cough, productive sputum, wheezing, SOB, chest feels like she is breathing through cotton.  Denies fever, nausea, vomiting.  Using 2L oxygen QHS.  Using Advair BID, zyrtec, benadryl.  She went to see daughter over thanksgiving in [place], but no other long travel, no recent injury or prolonged bed rest.     Past Medical History  Diagnosis Date  . COPD (chronic obstructive pulmonary disease)   . Prediabetes   . Barrett's esophagus   . Smoker   . GERD (gastroesophageal reflux disease)   . Hypertension   . Vitamin D deficiency    ROS as in HPI    Objective:   Physical Exam  Filed Vitals:   03/21/12 1126  Pulse: 88  Temp: 98.2 F (36.8 C)  Resp: 20    General appearance: alert, with dyspnea, having to talk in brief sentences   HEENT: normocephalic, sclerae anicteric, TMs pearly, nares patent, no discharge or erythema, pharynx normal Oral cavity: MMM, no lesions Neck: supple, no lymphadenopathy, no thyromegaly, no masses, no JVD Heart: RRR, normal S1, S2, no murmurs Lungs: diffuse wheezes, decrease air movement, I>E.   Pulses: 1+ symmetric, upper and lower extremities Ext: no edema, no cyanosis No calve tenderness  Assessment and Plan :    Encounter Diagnoses  Name Primary?  . Shortness of breath Yes  . Hypoxia   . COPD (chronic obstructive pulmonary disease)    81% pulse ox upon arrival.  Gave 1 round of duoneb nebulized treatment.  89% pulse ox room air after 1 round of duoneb.  Repeated nebulizer treatment with albuterol  only.  Pulse ox 86% room air.  Placed her on 2.5 L oxygen by nasal cannula.  Discussed her current symptoms and findings.  She is not willing to go to the ED despite my recommendation.    She has a nebulizer machine at home.  We gave her the tubing and handheld reservoir we used in office to use at home since she doesn't have tubing. she apparently requesting tubing supplies through [facility] and hasn't heard back (nebulizer was through [facility] and [facility]).  Scripts given today for albuterol solution for nebulizer.     Patient Instructions  Given your low oxygen level today, I recommend you go to the emergency department for further treatment.  Since you decline, this is what I recommend:  I am treating you for a COPD flare and possible lung infection.  Begin Levaquin antibiotic, one tablet daily for 1 week. Begin Prednisone taper steroid for inflammation in the lungs Begin Lasix 20mg  daily along with Potassium 10 twice daily for blood pressure and fluid.  Begin using Oxygen 2L/min round the clock for now.  If much better in 4-5 days, then we can back off the oxygen to night time again.  Use the nebulized albuterol every 4-6 hours as needed, but at least three times daily for the next 3-4 days.  (prescription for albuterol nebulized solution sent to pharmacy.  Continue your Advair as usual.  You can use Mucinex DM OTC  or generic equivalent such as [facility] of [employer] brand.  Avoid sudafed.    Rest, stay out of the cold weather.    Call tomorrow and Friday to update me on your symptoms and how you are doing.   If you are unable to get your breath or get worse in the meantime, call 911 or go to the emergency department.    Ideally she should have pulmonology consult.  She notes that she is on disability income now, but doesn't qualify for Medicaid or Medicare.  She doesn't qualify for Case Management through [facility] either.  She may be able to get additional care/resources through [facility] care clinic once they open in the spring.  Call in tomorrow and Friday with symptom update.

## 2012-03-21 NOTE — Patient Instructions (Addendum)
Given your low oxygen level today, I recommend you go to the emergency department for further treatment.  Since you decline, this is what I recommend:  I am treating you for a COPD flare and possible lung infection.  Begin Levaquin antibiotic, one tablet daily for 1 week. Begin Prednisone taper steroid for inflammation in the lungs Begin Lasix 20mg  daily along with Potassium 10 twice daily for blood pressure and fluid.  Begin using Oxygen 2L/min round the clock for now.  If much better in 4-5 days, then we can back off the oxygen to night time again.  Use the nebulized albuterol every 4-6 hours as needed, but at least three times daily for the next 3-4 days.  (prescription for albuterol nebulized solution sent to pharmacy.  Continue your Advair as usual.  You can use Mucinex DM OTC or generic equivalent such as [facility] of [employer] brand.  Avoid sudafed.    Rest, stay out of the cold weather.    Call tomorrow and Friday to update me on your symptoms and how you are doing.   If you are unable to get your breath or get worse in the meantime, call 911 or go to the emergency department.

## 2012-03-23 ENCOUNTER — Ambulatory Visit: Payer: Self-pay | Admitting: Medical

## 2012-04-02 ENCOUNTER — Encounter: Payer: Self-pay | Admitting: Medical

## 2012-04-25 LAB — PULMONARY FUNCTION TEST

## 2012-05-02 ENCOUNTER — Telehealth: Payer: Self-pay | Admitting: Medical

## 2012-05-02 NOTE — Telephone Encounter (Signed)
pls call [facility].  I think it is ok to try the Spiriva again, but after reviewing her pulmonary function studies, do they still feel like she is a candidate for their study?

## 2012-05-04 NOTE — Telephone Encounter (Signed)
I called and spoke with Marcelino Duster at [facility] about giving the okay for the Spiriva. The lady states that they did a PFT on her and she did qualify for the drug. CLS I spoke with Vincenza Hews tysinger PA-C and informed him of this. CLS

## 2012-05-08 ENCOUNTER — Telehealth: Payer: Self-pay | Admitting: Family Medicine

## 2012-05-08 NOTE — Telephone Encounter (Signed)
Message copied by Janeice Robinson on Tue May 08, 2012  9:56 AM ------      Message from: [place], DAVID S      Created: Tue May 08, 2012  8:08 AM       I have reviewed her pulmonary function studies that she had done.  Interestingly her graph shows a mild Obstructive pattern but a SEVERE restrictive pattern.              She is taking medications that probably influence the obstructive pattern somewhat, but given the severe RESTRICTIVE pattern which is NOT characteristic of COPD, I really think she needs to have a pulmonologist evaluate her symptoms and lung studies.  She may in fact have a different type of problem than previously thought.               I strongly recommend a referral to Dr. Maple Hudson or Dr. Sherene Sires.              She can participate in the [facility] study if she likes, and if need be, I can call them to discuss this further, but I am not comfortable in just calling this COPD.                    FYI - of note, last CXR here was unreadable due to chemical malfunction with the developing solution.  Thus last CXR valid is from 2011.  I reviewed back through prior records, my notes, including labs and prior cardiac eval in 2011.   Can't rule out cardiac causes of her symptoms, but everything has pointed mostly to a pulmonary problem given what data we have.  She does continue to smoke.   She has refused prior referrals and additional testing.  She has no insurance.  I have repeatedly asked her to look to [employer], other charities, DSS for financial help.

## 2012-05-08 NOTE — Telephone Encounter (Signed)
Vincenza Hews, here is the message.CLS

## 2012-05-09 NOTE — Telephone Encounter (Signed)
I called and spoke to the physician at [facility].  He said her efforts on the initial PFT wasn't great.  She is returning next week for repeat PFTs.  She still qualifies for their COPD study, and given the fact that she has no insurance and limited resources, this would at least give her long term therapy for COPD maintenance.  So we will c/t with plan for the study.   However, he will send me the next PFTs results as well.  She may still end up needing pulm consult, but I will await these study results and the plan is to see how she does the first several weeks into the study.

## 2012-05-16 ENCOUNTER — Telehealth: Payer: Self-pay | Admitting: Family Medicine

## 2012-05-16 NOTE — Telephone Encounter (Signed)
I fax over a copy of the patients CXR results to [facility] per Virl Diamond, PA_C. CLS

## 2012-05-18 ENCOUNTER — Ambulatory Visit
Admission: RE | Admit: 2012-05-18 | Discharge: 2012-05-18 | Disposition: A | Discharge status: Home / Self Care | Payer: No Typology Code available for payment source | Source: Ambulatory Visit | Attending: Family Medicine | Admitting: Family Medicine

## 2012-05-18 ENCOUNTER — Other Ambulatory Visit: Payer: Self-pay | Admitting: Family Medicine

## 2012-05-18 DIAGNOSIS — Z006 Encounter for examination for normal comparison and control in clinical research program: Secondary | ICD-10-CM

## 2012-05-21 ENCOUNTER — Encounter: Payer: Self-pay | Admitting: Medical

## 2012-06-01 ENCOUNTER — Telehealth: Payer: Self-pay | Admitting: Internal Medicine

## 2012-06-01 ENCOUNTER — Ambulatory Visit (INDEPENDENT_AMBULATORY_CARE_PROVIDER_SITE_OTHER): Payer: Self-pay | Admitting: Medical

## 2012-06-01 ENCOUNTER — Encounter: Payer: Self-pay | Admitting: Medical

## 2012-06-01 VITALS — BP 132/80 | HR 100 | Temp 98.0°F | Resp 24 | Wt 242.0 lb

## 2012-06-01 DIAGNOSIS — R0602 Shortness of breath: Secondary | ICD-10-CM

## 2012-06-01 DIAGNOSIS — R609 Edema, unspecified: Secondary | ICD-10-CM

## 2012-06-01 DIAGNOSIS — R7301 Impaired fasting glucose: Secondary | ICD-10-CM

## 2012-06-01 LAB — POCT URINALYSIS DIPSTICK
Bilirubin, UA: NEGATIVE
Blood, UA: NEGATIVE
Glucose, UA: NEGATIVE
Ketones, UA: NEGATIVE
Leukocytes, UA: NEGATIVE
pH, UA: 5

## 2012-06-01 NOTE — Telephone Encounter (Signed)
i have called pt and left message for her to call me back as soon as possible

## 2012-06-01 NOTE — Telephone Encounter (Signed)
Pt is coming in today at 4:15pm to see Vincenza Hews concerning this

## 2012-06-01 NOTE — Telephone Encounter (Signed)
Message copied by Joslyn Hy on Fri Jun 01, 2012 10:14 AM ------      Message from: [place], DAVID S      Created: Fri Jun 01, 2012  8:16 AM       pls call and have her come in due to edema, SOB.  Work her in somewhere today.            We will need full vitals, pulse ox, pull chart.            pls call and get her to come in .  Dr. Dayton Scrape from [facility] called and she apparently came in with marked dependent LE edema and SOB.  They postponed her PFTs today.  She is going to enter there study, but they suggested she call us regarding this symptoms. ------

## 2012-06-01 NOTE — Progress Notes (Signed)
Subjective:  Aimee Mcclure is a 62 y.o. female who presents for edema.   She has enrolled in a COPD study and is on inhaled ipratropium arm, and so far seems to be improving in her daily activity and exercise tolerance.  She is here at my request as Dr. Larene Beach with the [facility] study called today when she presented with worse edema and SOB.  She notes the last 2 days with increased edema, but currently improved from yesterday, less swelling, less SOB.  She is taking Lasix 20mg  daily   No other aggravating or relieving factors.    No other c/o.  The following portions of the patient's history were reviewed and updated as appropriate: allergies, current medications, past family history, past medical history, past social history, past surgical history and problem list.  Past Medical History  Diagnosis Date  . COPD (chronic obstructive pulmonary disease)   . Prediabetes   . Barrett's esophagus   . Smoker   . GERD (gastroesophageal reflux disease)   . Hypertension   . Vitamin D deficiency     Review of Systems Constitutional: -fever, -chills, -sweats, -unexpected weight change, +fatigue ENT: -runny nose, -ear pain, -sore throat Cardiology:  -chest pain, -palpitations, +edema Respiratory: -cough, +shortness of breath, -wheezing Gastroenterology: -abdominal pain, -nausea, -vomiting, -diarrhea, -constipation  Hematology: -bleeding or bruising problems Musculoskeletal: -arthralgias, -myalgias, -joint swelling, -back pain Ophthalmology: -vision changes Urology: -dysuria, -difficulty urinating, -hematuria, +decreased urinary frequency, -urgency Neurology: -headache, -weakness, -tingling, -numbness     Objective: Physical Exam  Vital signs reviewed  General appearance: alert, no distress, WD/WN HEENT: normocephalic, sclerae anicteric, conjunctiva pink and moist, TMs pearly, nares patent, no discharge or erythema, pharynx normal Oral cavity: MMM, no lesions Neck: supple, no  lymphadenopathy, no thyromegaly, no masses, no JVD Heart: RRR, normal S1, S2, no murmurs Lungs: scattered rhonchi, decreased breath sounds, no wheezes, no rales Abdomen: +bs, soft, non tender, non distended, no masses, no hepatomegaly, no splenomegaly Pulses: 1+ radial pulses, 1+ pedal pulses, normal cap refill Ext: 1+ nonpitting edema   Assessment: Encounter Diagnoses  Name Primary?  . Edema Yes  . SOB (shortness of breath)   . Impaired fasting blood sugar     Plan: Edema - multifactorial.  Increase Lasix to 40mg  daily, increase Kdur to 10mg  2 tablets BID, rest ,hydrate well, avoid added salt.   SOB - c/t with [facility] treatment drug.  Impaired fasting glucose - at risk for diabetes. HgbA1C 6.4% today.  discussed testing, diet, exercise.   Patient Instructions  Start checking your weights daily.   Get a scale from [employer].  If you notice >5 lb weight gain in a single day, then CALL us.  You have been taking Lasix 20mg  once daily along with potassium twice daily.   For the next 4-5 days, increase Lasix to 20mg , 2 tablets daily in the morning, and double the potassium as well, 2 tablets twice daily  In general, try and start measuring the amount of salt you consume.  It should be less than 2 grams daily.  I want you to call Monday with your daily weights.  Keep you water/fluid intake consistent the next several days to gauge how the Lasix is working.     !!!over the weekend, if >5 lb weight gain in a single day, if worse shortness of breath, if swelling throughout the body, then consider going to the emergency department.

## 2012-06-01 NOTE — Patient Instructions (Signed)
Start checking your weights daily.   Get a scale from [employer].  If you notice >5 lb weight gain in a single day, then CALL us.  You have been taking Lasix 20mg  once daily along with potassium twice daily.   For the next 4-5 days, increase Lasix to 20mg , 2 tablets daily in the morning, and double the potassium as well, 2 tablets twice daily  In general, try and start measuring the amount of salt you consume.  It should be less than 2 grams daily.  I want you to call Monday with your daily weights.  Keep you water/fluid intake consistent the next several days to gauge how the Lasix is working.     !!!over the weekend, if >5 lb weight gain in a single day, if worse shortness of breath, if swelling throughout the body, then consider going to the emergency department.

## 2012-06-06 ENCOUNTER — Telehealth: Payer: Self-pay | Admitting: Medical

## 2012-06-06 NOTE — Telephone Encounter (Signed)
Patient is aware of Shane Tysinger PAC recommendations. CLS 

## 2012-06-06 NOTE — Telephone Encounter (Signed)
Since she has only lost 2lb, have her c/t Lasix 20mg  2 tablets daily along with potassium.  Keep her fluid intake consistent.  Continue checking weights daily.  If >5lb weight gain OR loss in single day, then call.  otherwise call again in 4-5 days with weight update.

## 2012-06-11 ENCOUNTER — Other Ambulatory Visit: Payer: Self-pay | Admitting: Medical

## 2012-06-12 NOTE — Telephone Encounter (Signed)
Is this okay?

## 2012-06-12 NOTE — Telephone Encounter (Signed)
What is her weight today?  Is she still checking daily weights?  How much lasix is she taking the last few days?

## 2012-06-13 NOTE — Telephone Encounter (Signed)
Patient states that her weight today is 242 lbs and yes she is checking it every day. She said she is taking her Lasix 2 times a day. CLS

## 2012-06-14 MED ORDER — POTASSIUM CHLORIDE ER 10 MEQ PO CPCR: 10.0000 meq | ORAL_CAPSULE | Freq: Two times a day (BID) | ORAL | Status: DC

## 2012-06-14 NOTE — Telephone Encounter (Signed)
At this point she can try cutting back to 20mg  Lasix daily and 1 tablet of potassium BID and see if she feels fine vs increased swelling or SOB.  Going forward she can take Lasix 20mg , 1-2 tablets daily depending upon increase swelling or not.  If she notices day to day weight gain and swelling, then can increase Lasix up to 40mg  daily.    In other words, the lasix can be used 20mg  daily in general, but increased during times she feels as though the swelling is worse.  She does need to c/t Potassium along with it though.

## 2012-06-14 NOTE — Addendum Note (Signed)
Addended by: Jac Canavan on: 06/14/2012 05:27 AM   Modules accepted: Orders

## 2012-06-18 NOTE — Telephone Encounter (Signed)
Patient is aware of DaviD Tysinger's PA-C recommendations about her Lasix and ptassium. CLS

## 2012-08-18 ENCOUNTER — Other Ambulatory Visit: Payer: Self-pay | Admitting: Medical

## 2012-08-28 ENCOUNTER — Encounter: Payer: Self-pay | Admitting: Medical

## 2012-08-28 ENCOUNTER — Ambulatory Visit (INDEPENDENT_AMBULATORY_CARE_PROVIDER_SITE_OTHER): Payer: Self-pay | Admitting: Medical

## 2012-08-28 VITALS — BP 140/88 | HR 68 | Temp 97.9°F | Resp 20 | Wt 243.0 lb

## 2012-08-28 DIAGNOSIS — F430 Acute stress reaction: Secondary | ICD-10-CM

## 2012-08-28 DIAGNOSIS — G479 Sleep disorder, unspecified: Secondary | ICD-10-CM

## 2012-08-28 MED ORDER — ALPRAZOLAM 0.5 MG PO TABS: ORAL_TABLET | ORAL | Status: DC

## 2012-08-28 NOTE — Progress Notes (Signed)
Subjective: She is here because her "nerves are shot."  Lately her neighbors that live above her have been giving her a fit.   The neighbors small daughter runs back and forth in the house, recently even at midnight.  This kept her awake, aggravated her.  She talked to the mother who said that this was one night that she and her father were drinking.  She is concerned about what goes on upstairs.  At times she thinks the upstairs neighbor is trying to make her mad to force her out.  They will throw stuff down on the grown, will clean off their balcony while Ms. Coto is outside.  She thinks they deliberately do these things.  She has talked to the landlord, but the problem has gotten worse.  She is also on edge due to her ongoing health issues, but lately has really been upset with the neighbors.   Her daughter is [place], and she is worried about her safety given that martial law was instated there yesterday.  Daughter is homesick, and Ms. [place] is worried about her.   Has been feeling anxious and nerves giving her trouble for several month.   In the past has used prn Xanax, has been on Symbiax in the past as well.   In the past has been treated for depression.  Has been praying a lot, trying to deal with things on her own, but feels that she needs some medication to help.     Past Medical History  Diagnosis Date  . COPD (chronic obstructive pulmonary disease)   . Prediabetes   . Barrett's esophagus   . Smoker   . GERD (gastroesophageal reflux disease)   . Hypertension   . Vitamin D deficiency    ROS as in subjective  Objective:  Gen: nad, wd, wn Psych: good eye contact, dressed appropriately, answers questions appropriately  Assessment: Encounter Diagnoses  Name Primary?  . Acute stress reaction Yes  . Sleep disturbance    Plan: Discussed her concerns, counseled on things to do in talking with her neighbor as well as landlord to help resolve the apartment issue.  If needed, call police  for noise compliant if it gets that bad.   Discussed ways to deal with anxiety, can use Xanax prn.   discussed risks/benefits.   Script given today.  call back 2-4 wk for update on how things are going.

## 2012-09-07 ENCOUNTER — Other Ambulatory Visit: Payer: Self-pay | Admitting: Medical

## 2012-09-07 ENCOUNTER — Telehealth: Payer: Self-pay | Admitting: Medical

## 2012-09-07 MED ORDER — CITALOPRAM HYDROBROMIDE 20 MG PO TABS: ORAL_TABLET | ORAL | Status: DC

## 2012-09-07 NOTE — Telephone Encounter (Signed)
Pt called and stated at last office visit she was told if she needed something for everyday to call and that is what she is doing. She states that she doesn't needs a high dose because just half of a xanex was working just fine. [facility] uses [facility] on [address]

## 2012-09-14 NOTE — Telephone Encounter (Signed)
Patient is aware of the Celexa that she is to take for everyday. CLS

## 2012-09-14 NOTE — Telephone Encounter (Signed)
Aimee Mcclure THIS IS STILL IN MY BOX HAS THIS BEEN ADDRESSED

## 2012-09-14 NOTE — Telephone Encounter (Signed)
I started her on Citalopram.  Aimee Mcclure - the patient is aware correct?

## 2012-09-14 NOTE — Telephone Encounter (Signed)
LMOM TO CB. CLS 

## 2012-10-09 ENCOUNTER — Other Ambulatory Visit: Payer: Self-pay | Admitting: Medical

## 2012-10-09 ENCOUNTER — Telehealth: Payer: Self-pay | Admitting: Internal Medicine

## 2012-10-09 MED ORDER — PAROXETINE HCL 10 MG PO TABS: 10.0000 mg | ORAL_TABLET | ORAL | Status: DC

## 2012-10-09 MED ORDER — BUPROPION HCL ER (XL) 150 MG PO TB24: 150.0000 mg | ORAL_TABLET | Freq: Every day | ORAL | Status: DC

## 2012-10-09 NOTE — Telephone Encounter (Signed)
I left a voicemail for the patient. CLS

## 2012-10-09 NOTE — Telephone Encounter (Signed)
Pt states the celexa is calling her to be nausea and she stated taking nexium and the pain has eased up some but would like something else besides celexa cause it just makes her sick. [facility] to [facility] [address]

## 2012-10-09 NOTE — Telephone Encounter (Signed)
Lets have her switch to Wellbutrin XL 150mg  once daily.  Stop celexa and switch to this.  F/u 2-3 wk

## 2012-10-09 NOTE — Telephone Encounter (Signed)
Patient states she already tried Wellbutrin in the past and it didn't work for her. She said the celexa is messing her stomach up and the she has also tried Lexapro and it didn't work either. CLS

## 2012-10-09 NOTE — Telephone Encounter (Signed)
I sent Paxil instead.  This is an old medication with good tolerability.   Lets see if this works ok for her.

## 2012-10-10 NOTE — Telephone Encounter (Signed)
I left the patient a message about her medication. CLS

## 2012-12-03 ENCOUNTER — Ambulatory Visit (INDEPENDENT_AMBULATORY_CARE_PROVIDER_SITE_OTHER): Payer: Self-pay | Admitting: Medical

## 2012-12-03 ENCOUNTER — Encounter: Payer: Self-pay | Admitting: Medical

## 2012-12-03 VITALS — BP 150/80 | HR 84 | Temp 98.4°F | Resp 20 | Wt 241.0 lb

## 2012-12-03 DIAGNOSIS — M2550 Pain in unspecified joint: Secondary | ICD-10-CM

## 2012-12-03 DIAGNOSIS — I10 Essential (primary) hypertension: Secondary | ICD-10-CM

## 2012-12-03 DIAGNOSIS — R079 Chest pain, unspecified: Secondary | ICD-10-CM

## 2012-12-03 DIAGNOSIS — J449 Chronic obstructive pulmonary disease, unspecified: Secondary | ICD-10-CM

## 2012-12-03 DIAGNOSIS — R0602 Shortness of breath: Secondary | ICD-10-CM

## 2012-12-03 DIAGNOSIS — R109 Unspecified abdominal pain: Secondary | ICD-10-CM

## 2012-12-03 DIAGNOSIS — J4489 Other specified chronic obstructive pulmonary disease: Secondary | ICD-10-CM

## 2012-12-03 MED ORDER — ESOMEPRAZOLE MAGNESIUM 40 MG PO CPDR: 40.0000 mg | DELAYED_RELEASE_CAPSULE | Freq: Every day | ORAL | Status: DC

## 2012-12-03 NOTE — Progress Notes (Signed)
Subjective: Here for multiple c/o.   Been having some pains in chest/abdomen, not sure which.  Pains in both sides of ribs/upper abdomen, some sternal chest pain.  Has rawness feeling/raw pain.  Worse after eating or activity.   Using Bengay topically along chest left and right to help with sleep.   Feels full all the time.   At times avoids eating due to discomfort in chest/abdomen.  Legs swell some, but no worse than usual.  Using lasix once daily.   No recent weight fluctuations.    Had recent cysts in left axilla, squeezed them and got pus out.  These have resolved.  BPs have been a little elevated.  Recently was 150s/90s.  Is enrolled in the [facility] COPD study.   They check her BP and EKG and PFT there.     Using Paxil, but sometimes doesn't take it as it makes her sleepy.  Lung function seems to be same as usual.   Past Medical History  Diagnosis Date  . COPD (chronic obstructive pulmonary disease)   . Prediabetes   . Barrett's esophagus   . Smoker   . GERD (gastroesophageal reflux disease)   . Hypertension   . Vitamin D deficiency    Review of Systems Constitutional: -fever, -chills, +sweats, -unexpected weight change,+fatigue ENT: +runny nose, -ear pain, -sore throat Cardiology:  +chest pain, +occasional flutter, +chronic LE edema, no worse or better Respiratory: -cough, -shortness of breath, -wheezing Gastroenterology: -abdominal pain, +fullness, early satiety, +gas, -nausea, -vomiting, -diarrhea, -constipation  Hematology: -bleeding or bruising problems Musculoskeletal: +arthralgias, -myalgias, -joint swelling, -back pain Ophthalmology: -vision changes Urology: -dysuria, -difficulty urinating, -hematuria, -urinary frequency, -urgency Neurology: -headache, -weakness, -tingling, -numbness     Objective: Filed Vitals:   12/03/12 1018  BP: 150/80  Pulse: 84  Temp: 98.4 F (36.9 C)  Resp: 20    General appearance: alert, no distress HEENT: normocephalic, sclerae  anicteric, TMs pearly, nares patent, no discharge or erythema, pharynx normal Oral cavity: MMM, no lesions Neck: supple, no lymphadenopathy, no thyromegaly, no masses, no JVD Heart: RRR, normal S1, S2, no murmurs Lungs: decreased breath sounds, faint wheezes, scattered rhonchi or rales Abdomen: +bs, soft, quite tender epigastric and LUQ region.  Otherwise nontender, non distended, no masses, no hepatomegaly, no splenomegaly Pulses: 2+ symmetric, upper and lower extremities Ext: 1+ mild ankle edema, nonpitting   Assessment: Encounter Diagnoses  Name Primary?  . Abdominal pain, unspecified site Yes  . SOB (shortness of breath)   . COPD (chronic obstructive pulmonary disease)   . Arthralgia   . Essential hypertension, benign      Plan: Can't rule out other causes of abdominal and chest pain today, but seems more GI related today.    Our office manager gave her additional resource info today.  I strongly suggested she look into other options for medical care/coverage.  She needs pulmonology consult, repeat EGD and GI consult, echocardiogram.   She has not agreed to any of these in the past year due to lack of financial resources, apparently didn't qualify for medicaid, and we have been in limbo due to these limitations.   I think she would be better served at the [facility] care clinic or getting possible additional resources to proceed with needed medical evaluation.  She is aware of the option to be seen in the ED.     Currently we will begin trial of Nexium 40mg  samples, avoid GERD triggers.  Advised she increase her oxygen to 24 hours daily  for the time begin since her pulse ox was 83% today which is not unusual for her.  She declines referrals today.   She wil agree to labs if no improvement in the next week.  I reviewed recent EKG through [facility] which showed no new changes over the last few months.  She is still enrolled in one of their COPD studies.  Follow-up pending patient  call back in 1wk.

## 2012-12-05 ENCOUNTER — Encounter: Payer: Self-pay | Admitting: Medical

## 2013-01-29 ENCOUNTER — Telehealth: Payer: Self-pay | Admitting: Family Medicine

## 2013-01-29 NOTE — Telephone Encounter (Signed)
Has she not had this, did she not get the vaccine in the COPD study? I thought she did.

## 2013-01-29 NOTE — Telephone Encounter (Signed)
Patient wanted to know if she could get a RX for the pneumonia shot. CLS

## 2013-01-30 NOTE — Telephone Encounter (Signed)
She can come in for both flu and pneumococcal vaccine.  If she doesn't have insurance, she may want to check prices elsewhere though.

## 2013-01-30 NOTE — Telephone Encounter (Signed)
PT DID NOT QULIFIE FOR STUDY  SO SHE HAS NOT GOTTEN PNEUMO SHOT AND SHE WOULD ALSO LIKE TO GET FLU SHOT TO

## 2013-01-30 NOTE — Telephone Encounter (Signed)
LEFT MESSAGE FOR PT THAT SHANE WANTED TO BE SURE BUT HE THOUGHT SHE HAD GOT PNEUMO SHOT WITH COPD STUDY TO PLEASE CALL BACK AND LET us KNOW

## 2013-02-01 NOTE — Telephone Encounter (Signed)
pls write out script and i'll sign

## 2013-02-01 NOTE — Telephone Encounter (Signed)
Pt needs written Rx to take to local pharmacy for pneumonia shot

## 2013-02-01 NOTE — Telephone Encounter (Signed)
Left message Advised pt rx for Pneumonia shot ready for pick up-lm

## 2013-02-01 NOTE — Telephone Encounter (Signed)
Put on ur desk at nurses station for you to sign

## 2013-02-13 ENCOUNTER — Other Ambulatory Visit: Payer: Self-pay | Admitting: Medical

## 2013-02-23 ENCOUNTER — Other Ambulatory Visit: Payer: Self-pay | Admitting: Medical

## 2013-03-11 ENCOUNTER — Encounter: Payer: Self-pay | Admitting: Internal Medicine

## 2013-03-22 ENCOUNTER — Other Ambulatory Visit: Payer: Self-pay | Admitting: Family Medicine

## 2013-03-22 MED ORDER — ALBUTEROL SULFATE HFA 108 (90 BASE) MCG/ACT IN AERS: 2.0000 | INHALATION_SPRAY | Freq: Four times a day (QID) | RESPIRATORY_TRACT | Status: DC | PRN

## 2013-03-22 MED ORDER — PAROXETINE HCL 10 MG PO TABS: 10.0000 mg | ORAL_TABLET | Freq: Every day | ORAL | Status: DC

## 2013-04-24 ENCOUNTER — Other Ambulatory Visit: Payer: Self-pay | Admitting: Medical

## 2013-04-24 NOTE — Telephone Encounter (Signed)
Is this okay to refill? 

## 2013-06-25 ENCOUNTER — Telehealth: Payer: Self-pay | Admitting: Medical

## 2013-06-25 ENCOUNTER — Other Ambulatory Visit: Payer: Self-pay | Admitting: Medical

## 2013-06-25 MED ORDER — FLUTICASONE-SALMETEROL 250-50 MCG/DOSE IN AEPB: 1.0000 | INHALATION_SPRAY | Freq: Two times a day (BID) | RESPIRATORY_TRACT | Status: DC

## 2013-06-25 NOTE — Telephone Encounter (Signed)
   Please call patient  Needs refill on advair    [facility] [phone]

## 2013-06-27 ENCOUNTER — Other Ambulatory Visit: Payer: Self-pay | Admitting: Medical

## 2013-06-27 NOTE — Telephone Encounter (Signed)
Ok to RF? WGL 

## 2013-07-11 ENCOUNTER — Encounter: Payer: Self-pay | Admitting: Internal Medicine

## 2013-08-02 ENCOUNTER — Other Ambulatory Visit: Payer: Self-pay | Admitting: Medical

## 2013-08-02 NOTE — Telephone Encounter (Signed)
IS THIS OKAY TO REFILL 

## 2013-08-09 ENCOUNTER — Encounter: Payer: Self-pay | Admitting: *Deleted

## 2013-08-09 DIAGNOSIS — Z9981 Dependence on supplemental oxygen: Secondary | ICD-10-CM | POA: Insufficient documentation

## 2013-08-09 NOTE — Progress Notes (Signed)
Patient ID: Aimee Mcclure, female   DOB: 04/06/1951, 63 y.o.   MRN: [id number] Pt scheduled for recall colonoscopy with Dr Leone PayorGessner 08/22/2013.  Pt here for PV today.  Pt has COPD and is having shortness of breath and wheezing during visit.  She says she thinks she has an infection and is going to see her PCP today.  Pt uses home oxygen at night.  Explained to pt that she would need and OV with Dr  Leone PayorGessner before scheduling colonoscopy.  Pt wants to wait to schedule OV until she is past current problem with breathing.  She is aware that Dr Marvell FullerGessner's schedule is full until July and she will call to make appt in several weeks.  Colonoscopy scheduled for 5/14 and Previsit cancelled.

## 2013-08-22 ENCOUNTER — Encounter: Payer: Self-pay | Admitting: Internal Medicine

## 2013-09-07 ENCOUNTER — Encounter: Payer: Self-pay | Admitting: Internal Medicine

## 2013-09-18 ENCOUNTER — Ambulatory Visit (INDEPENDENT_AMBULATORY_CARE_PROVIDER_SITE_OTHER): Payer: Medicare Other | Admitting: Internal Medicine

## 2013-09-18 ENCOUNTER — Encounter: Payer: Self-pay | Admitting: Internal Medicine

## 2013-09-18 VITALS — BP 146/98 | HR 97 | Temp 99.0°F | Ht 63.25 in | Wt 237.0 lb

## 2013-09-18 DIAGNOSIS — K219 Gastro-esophageal reflux disease without esophagitis: Secondary | ICD-10-CM

## 2013-09-18 DIAGNOSIS — R7309 Other abnormal glucose: Secondary | ICD-10-CM

## 2013-09-18 DIAGNOSIS — R0602 Shortness of breath: Secondary | ICD-10-CM

## 2013-09-18 DIAGNOSIS — J449 Chronic obstructive pulmonary disease, unspecified: Secondary | ICD-10-CM

## 2013-09-18 DIAGNOSIS — I10 Essential (primary) hypertension: Secondary | ICD-10-CM

## 2013-09-18 MED ORDER — ALBUTEROL SULFATE (2.5 MG/3ML) 0.083% IN NEBU
2.5000 mg | INHALATION_SOLUTION | Freq: Four times a day (QID) | RESPIRATORY_TRACT | Status: DC | PRN
Start: 2013-09-18 — End: 2013-10-14

## 2013-09-18 MED ORDER — ARFORMOTEROL TARTRATE 15 MCG/2ML IN NEBU: 15.0000 ug | INHALATION_SOLUTION | Freq: Two times a day (BID) | RESPIRATORY_TRACT | Status: DC

## 2013-09-18 MED ORDER — IPRATROPIUM BROMIDE 0.02 % IN SOLN: 0.5000 mg | Freq: Four times a day (QID) | RESPIRATORY_TRACT | Status: DC

## 2013-09-18 NOTE — Assessment & Plan Note (Signed)
Continue Nexium 

## 2013-09-18 NOTE — Assessment & Plan Note (Signed)
Monitor for now.  Consider start generic ARB.   BP: 146/98 mmHg

## 2013-09-18 NOTE — Progress Notes (Signed)
Subjective:    Patient ID: Aimee Mcclure, female    DOB: 01/24/1951, 63 y.o.   MRN: [id number]  HPI  63 year old white female to establish. She was previously followed by local PA - Crosby Oysteravid Tysinger.  She has history of obesity and severe COPD. She has been a smoker for over 45 years. She is oxygen dependent. She has not been able to use her maintenance inhalers due to financial issues. She has not followed up on regular basis with her previous PCP due to lack of insurance. Over the last several weeks patient reports worsening fatigue and lethargy. She has a home oxygen concentrator.  No previous pulmonary function testing. She reports she received Pneumovax in 2014.  Patient also has history of Barrett's esophagus. She has been seen by Dr. Leone PayorGessner in the past. She currently takes Nexium daily.  Chronic tobacco use-patient has decreased to half pack daily.    Review of Systems  Constitutional: Negative for activity change, appetite change and unexpected weight change.  Eyes: Negative for visual disturbance.  Respiratory: Negative for cough, shortness of breath and dyspnea with exertion.  orthopnea Cardiovascular: intermittent non exertional chest pain.  Genitourinary: Negative for difficulty urinating.  Neurological: Negative for headaches.  Gastrointestinal: Negative for abdominal pain,  melena or hematochezia Psych: Negative for depression or anxiety Endo:  Patient reports she diagnosed with type 2 diabetes.  All her medications were stopped in 2006-2007 due to possible allergic reaction.        Past Medical History  Diagnosis Date  . COPD (chronic obstructive pulmonary disease)     oxygen dependent  . Prediabetes   . Barrett's esophagus   . Smoker   . GERD (gastroesophageal reflux disease)   . Hypertension   . Vitamin D deficiency   . Obesity     History   Social History  . Marital Status: Single    Spouse Name: N/A    Number of Children: N/A  . Years of Education:  N/A   Occupational History  . Not on file.   Social History Main Topics  . Smoking status: Current Every Day Smoker    Types: Cigarettes  . Smokeless tobacco: Not on file  . Alcohol Use: No  . Drug Use: No  . Sexual Activity: Not on file   Other Topics Concern  . Not on file   Social History Narrative  . No narrative on file    Past Surgical History  Procedure Laterality Date  . Abdominal hysterectomy      partial  . Oophorectomy    . Laparoscopy      abdomen/pelvis  . Cholecystectomy      No family history on file.  Allergies  Allergen Reactions  . Codeine   . Sulfonamide Derivatives     Current Outpatient Prescriptions on File Prior to Visit  Medication Sig Dispense Refill  . cetirizine (ZYRTEC) 10 MG tablet Take 1 tablet (10 mg total) by mouth daily.  30 tablet  0  . cholecalciferol (VITAMIN D) 1000 UNITS tablet Take 2,000 Units by mouth daily. PATIENT IS TAKING 2,000      . furosemide (LASIX) 20 MG tablet take 1 tablet by mouth once daily  30 tablet  5  . PARoxetine (PAXIL) 10 MG tablet take 1 tablet by mouth once daily  30 tablet  2   No current facility-administered medications on file prior to visit.    BP 146/98  Pulse 97  Temp(Src) 99 F (37.2 C) (Oral)  Ht 5' 3.25" (1.607 m)  Wt 237 lb (107.502 kg)  BMI 41.63 kg/m2    Objective:   Physical Exam  Constitutional: She is oriented to person, place, and time. No distress.  Pleasant, obese 63 year old female  HENT:  Head: Normocephalic and atraumatic.  Right Ear: External ear normal.  Left Ear: External ear normal.  Mouth/Throat: Oropharynx is clear and moist.  Crowded oropharynx  Eyes: Conjunctivae and EOM are normal. Pupils are equal, round, and reactive to light.  Neck: Neck supple.  No carotid bruit  Cardiovascular: Normal rate, regular rhythm and normal heart sounds.   No murmur heard. Pulmonary/Chest: Effort normal.  Prolonged expiration, scattered faint expiratory wheezing  bilaterally  Abdominal: Soft. Bowel sounds are normal. She exhibits no mass. There is no tenderness.  Musculoskeletal:  +1 bilateral lower extremity edema  Neurological: She is alert and oriented to person, place, and time. No cranial nerve deficit.  Skin: Skin is warm and dry.  Psychiatric: She has a normal mood and affect. Her behavior is normal.          Assessment & Plan:

## 2013-09-18 NOTE — Assessment & Plan Note (Addendum)
Patient clinically has severe oxygen dependent COPD.  Oxygen saturation on room air is 84% at rest.  Arrange 24-hour home oxygen including portable oxygen therapy. Refer to pulmonary for pulmonary function testing.  Patient not able to use Advair due to financial constraints. Start nebulized albuterol and ipratropium every 6 hours, Brovana and budesonide bid.  Patient may also have Cor pulmonale. Check BNP and 2-D echo. Continue same dose of furosemide.

## 2013-09-18 NOTE — Assessment & Plan Note (Signed)
Monitor A1c 

## 2013-09-19 LAB — BASIC METABOLIC PANEL
BUN: 12 mg/dL (ref 6–23)
CO2: 39 mEq/L — ABNORMAL HIGH (ref 19–32)
CREATININE: 0.6 mg/dL (ref 0.4–1.2)
Calcium: 9.3 mg/dL (ref 8.4–10.5)
Chloride: 96 mEq/L (ref 96–112)
GFR: 104.54 mL/min (ref >60.00)
Glucose, Bld: 78 mg/dL (ref 70–99)
Potassium: 3.9 mEq/L (ref 3.5–5.1)
Sodium: 143 mEq/L (ref 135–145)

## 2013-09-19 LAB — CBC WITH DIFFERENTIAL/PLATELET
BASOS PCT: 0.6 % (ref 0.0–3.0)
Basophils Absolute: 0 10*3/uL (ref 0.0–0.1)
EOS ABS: 0.1 10*3/uL (ref 0.0–0.7)
Eosinophils Relative: 1 % (ref 0.0–5.0)
HCT: 46.8 % — ABNORMAL HIGH (ref 36.0–46.0)
Hemoglobin: 15 g/dL (ref 12.0–15.0)
LYMPHS PCT: 22.3 % (ref 12.0–46.0)
Lymphs Abs: 1.7 10*3/uL (ref 0.7–4.0)
MCHC: 32.1 g/dL (ref 30.0–36.0)
MCV: 92.9 fl (ref 78.0–100.0)
Monocytes Absolute: 0.6 10*3/uL (ref 0.1–1.0)
Monocytes Relative: 7.9 % (ref 3.0–12.0)
Neutro Abs: 5.3 10*3/uL (ref 1.4–7.7)
Neutrophils Relative %: 68.2 % (ref 43.0–77.0)
PLATELETS: 295 10*3/uL (ref 150.0–400.0)
RBC: 5.04 Mil/uL (ref 3.87–5.11)
RDW: 13.8 % (ref 11.5–15.5)
WBC: 7.8 10*3/uL (ref 4.0–10.5)

## 2013-09-19 LAB — HEMOGLOBIN A1C: HEMOGLOBIN A1C: 6.2 % (ref 4.6–6.5)

## 2013-09-19 LAB — HEPATIC FUNCTION PANEL
ALT: 14 U/L (ref 0–35)
AST: 20 U/L (ref 0–37)
Albumin: 3.5 g/dL (ref 3.5–5.2)
Alkaline Phosphatase: 65 U/L (ref 39–117)
BILIRUBIN DIRECT: 0.1 mg/dL (ref 0.0–0.3)
Total Bilirubin: 0.8 mg/dL (ref 0.2–1.2)
Total Protein: 7.2 g/dL (ref 6.0–8.3)

## 2013-09-19 LAB — LIPID PANEL
CHOLESTEROL: 172 mg/dL (ref 0–200)
HDL: 33.2 mg/dL — AB (ref >39.00)
LDL CALC: 117 mg/dL — AB (ref 0–99)
NonHDL: 138.8
TRIGLYCERIDES: 111 mg/dL (ref 0.0–149.0)
Total CHOL/HDL Ratio: 5
VLDL: 22.2 mg/dL (ref 0.0–40.0)

## 2013-09-19 LAB — BRAIN NATRIURETIC PEPTIDE: Pro B Natriuretic peptide (BNP): 34 pg/mL (ref 0.0–100.0)

## 2013-09-19 LAB — TSH: TSH: 0.88 u[IU]/mL (ref 0.35–4.50)

## 2013-09-20 ENCOUNTER — Other Ambulatory Visit: Payer: Self-pay | Admitting: Internal Medicine

## 2013-09-20 ENCOUNTER — Telehealth: Payer: Self-pay | Admitting: Internal Medicine

## 2013-09-20 DIAGNOSIS — J449 Chronic obstructive pulmonary disease, unspecified: Secondary | ICD-10-CM

## 2013-09-20 DIAGNOSIS — R0602 Shortness of breath: Secondary | ICD-10-CM

## 2013-09-20 NOTE — Telephone Encounter (Addendum)
Pt was seen this week and is waiting for order for new machine . Pt is on oxygen. Pt gave paperwork to cindy that was from [facility]

## 2013-09-24 NOTE — Telephone Encounter (Signed)
Have you seen this ?

## 2013-09-25 NOTE — Telephone Encounter (Signed)
No I haven't seen paperwork but do whatever it takes to her the oxygen that she needs.  I think she already has concentrator.  She needs 24 hr continuous oxygen.  [facility]

## 2013-09-26 NOTE — Telephone Encounter (Signed)
[facility] and she thought pulmonary was going to take care of it.  She seen referral in EPIC.  She asked me to fax order and they would handle it.  Order was faxed, Melissa aware

## 2013-09-27 ENCOUNTER — Telehealth: Payer: Self-pay | Admitting: Internal Medicine

## 2013-09-27 NOTE — Telephone Encounter (Signed)
Pt called and would like for someone to call her back to discuss changing home care

## 2013-09-27 NOTE — Addendum Note (Signed)
Addended by: Alfred LevinsWYRICK, CINDY D on: 09/27/2013 05:04 PM   Modules accepted: Orders

## 2013-10-09 ENCOUNTER — Institutional Professional Consult (permissible substitution): Payer: Medicare Other | Admitting: Pulmonary Disease

## 2013-10-09 NOTE — Telephone Encounter (Signed)
Pt stated that she got her oxygen set up but the people were rude.  She just wanted Dr Artist PaisYoo to know.  Nothing further is needed

## 2013-10-14 ENCOUNTER — Ambulatory Visit (INDEPENDENT_AMBULATORY_CARE_PROVIDER_SITE_OTHER): Payer: Medicare Other | Admitting: Internal Medicine

## 2013-10-14 ENCOUNTER — Encounter: Payer: Self-pay | Admitting: Internal Medicine

## 2013-10-14 ENCOUNTER — Telehealth: Payer: Self-pay | Admitting: Internal Medicine

## 2013-10-14 ENCOUNTER — Ambulatory Visit (HOSPITAL_COMMUNITY): Payer: Medicare Other | Attending: Cardiovascular Disease | Admitting: Cardiology

## 2013-10-14 VITALS — BP 132/72 | HR 92 | Temp 98.7°F | Ht 63.25 in | Wt 245.0 lb

## 2013-10-14 DIAGNOSIS — Z23 Encounter for immunization: Secondary | ICD-10-CM

## 2013-10-14 DIAGNOSIS — R7309 Other abnormal glucose: Secondary | ICD-10-CM

## 2013-10-14 DIAGNOSIS — R0602 Shortness of breath: Secondary | ICD-10-CM

## 2013-10-14 DIAGNOSIS — J438 Other emphysema: Secondary | ICD-10-CM

## 2013-10-14 DIAGNOSIS — E538 Deficiency of other specified B group vitamins: Secondary | ICD-10-CM

## 2013-10-14 DIAGNOSIS — L0292 Furuncle, unspecified: Secondary | ICD-10-CM | POA: Insufficient documentation

## 2013-10-14 DIAGNOSIS — J4489 Other specified chronic obstructive pulmonary disease: Secondary | ICD-10-CM | POA: Insufficient documentation

## 2013-10-14 DIAGNOSIS — J9610 Chronic respiratory failure, unspecified whether with hypoxia or hypercapnia: Secondary | ICD-10-CM | POA: Insufficient documentation

## 2013-10-14 DIAGNOSIS — J439 Emphysema, unspecified: Secondary | ICD-10-CM

## 2013-10-14 DIAGNOSIS — J449 Chronic obstructive pulmonary disease, unspecified: Secondary | ICD-10-CM | POA: Insufficient documentation

## 2013-10-14 DIAGNOSIS — L0293 Carbuncle, unspecified: Secondary | ICD-10-CM

## 2013-10-14 MED ORDER — IPRATROPIUM-ALBUTEROL 0.5-2.5 (3) MG/3ML IN SOLN: 3.0000 mL | Freq: Four times a day (QID) | RESPIRATORY_TRACT | Status: DC | PRN

## 2013-10-14 MED ORDER — ALBUTEROL SULFATE HFA 108 (90 BASE) MCG/ACT IN AERS: 2.0000 | INHALATION_SPRAY | Freq: Four times a day (QID) | RESPIRATORY_TRACT | Status: AC | PRN

## 2013-10-14 MED ORDER — ARFORMOTEROL TARTRATE 15 MCG/2ML IN NEBU: 15.0000 ug | INHALATION_SOLUTION | Freq: Two times a day (BID) | RESPIRATORY_TRACT | Status: DC

## 2013-10-14 MED ORDER — PRAVASTATIN SODIUM 20 MG PO TABS: 20.0000 mg | ORAL_TABLET | Freq: Every day | ORAL | Status: DC

## 2013-10-14 MED ORDER — BUDESONIDE 0.25 MG/2ML IN SUSP: 0.2500 mg | Freq: Two times a day (BID) | RESPIRATORY_TRACT | Status: DC

## 2013-10-14 MED ORDER — CYANOCOBALAMIN 1000 MCG/ML IJ SOLN: 1000.0000 ug | Freq: Once | INTRAMUSCULAR | Status: DC

## 2013-10-14 NOTE — Telephone Encounter (Signed)
Can you find out which company involved.

## 2013-10-14 NOTE — Patient Instructions (Signed)
Please complete the following lab tests before your next follow up appointment: FLP, LFTs - 272.4 BNP - 786.09

## 2013-10-14 NOTE — Assessment & Plan Note (Signed)
Less dyspnea with 24 hr supplemental oxygen.  Arrange PFTs before pulmonary referral.  Change to Combivent nebulizer.  Arrange Brovana and Pulmicort nebs through [facility].  Patient counseled on smoking cessation.

## 2013-10-14 NOTE — Progress Notes (Signed)
Pre visit review using our clinic review tool, if applicable. No additional management support is needed unless otherwise documented below in the visit note. 

## 2013-10-14 NOTE — Progress Notes (Signed)
   Subjective:    Patient ID: Aimee Mcclure, female    DOB: 05/22/1950, 63 y.o.   MRN: [id number]  HPI  63 year old white female with severe COPD, borderline type 2 diabetes and chronic tobacco use for followup. Patient now on constant supplemental 2 L of oxygen.  She is feeling better.  She is not using her Brovana or Pulmicort yet.  Previous maintenance inhalers cost prohibitive.  No change in smoking.  She is awaiting referral to pulmonary.  She complains of getting small boils of lower abdomen.  No fever.  Localized redness.  She is planning to move to [place] area in Sept to live with her daughter.  Review of Systems Negative for chest pain, negative for fever.    Past Medical History  Diagnosis Date  . COPD (chronic obstructive pulmonary disease)     oxygen dependent  . Prediabetes   . Barrett's esophagus   . Smoker   . GERD (gastroesophageal reflux disease)   . Hypertension   . Vitamin D deficiency   . Obesity     History   Social History  . Marital Status: Divorced    Spouse Name: N/A    Number of Children: N/A  . Years of Education: N/A   Occupational History  . Not on file.   Social History Main Topics  . Smoking status: Current Every Day Smoker -- 0.50 packs/day for 45 years    Types: Cigarettes  . Smokeless tobacco: Not on file  . Alcohol Use: No  . Drug Use: No  . Sexual Activity: Not on file   Other Topics Concern  . Not on file   Social History Narrative  . No narrative on file    Past Surgical History  Procedure Laterality Date  . Abdominal hysterectomy      partial  . Oophorectomy    . Laparoscopy      abdomen/pelvis  . Cholecystectomy      No family history on file.  Allergies  Allergen Reactions  . Codeine   . Sulfonamide Derivatives     Current Outpatient Prescriptions on File Prior to Visit  Medication Sig Dispense Refill  . cetirizine (ZYRTEC) 10 MG tablet Take 1 tablet (10 mg total) by mouth daily.  30 tablet  0  .  cholecalciferol (VITAMIN D) 1000 UNITS tablet Take 2,000 Units by mouth daily. PATIENT IS TAKING 2,000      . furosemide (LASIX) 20 MG tablet take 1 tablet by mouth once daily  30 tablet  5   No current facility-administered medications on file prior to visit.    BP 132/72  Pulse 92  Temp(Src) 98.7 F (37.1 C) (Oral)  Ht 5' 3.25" (1.607 m)  Wt 245 lb (111.131 kg)  BMI 43.03 kg/m2  SpO2 86%    Objective:   Physical Exam  Constitutional: She is oriented to person, place, and time. She appears well-developed and well-nourished. No distress.  HENT:  Head: Normocephalic and atraumatic.  Mouth/Throat: Oropharynx is clear and moist.  Cardiovascular: Normal rate, regular rhythm and normal heart sounds.  Exam reveals no gallop.   No murmur heard. Pulmonary/Chest: Effort normal.  Prolonged expiration,  decreased breath sounds throughout  Musculoskeletal:  Trace lower extremity edema bilaterally  Neurological: She is alert and oriented to person, place, and time.  Skin: Skin is warm and dry.  Psychiatric: She has a normal mood and affect. Her behavior is normal.       Assessment & Plan:

## 2013-10-14 NOTE — Progress Notes (Signed)
Echo performed. 

## 2013-10-14 NOTE — Telephone Encounter (Signed)
[facility] is calling in regards to pt rx nebulizer, states the pharmacy they used does not carry brovana and the insurance requires that pt get all of there nebulizer meds at the same pharmacy. Need to know what the alternatives are.

## 2013-10-14 NOTE — Assessment & Plan Note (Signed)
Patient has small boil of right lower abdomen / supra pubic area.  I advised local care.  No need for systemic antibiotics or I&D.

## 2013-10-14 NOTE — Assessment & Plan Note (Signed)
Continue to manage with diet Lab Results  Component Value Date   HGBA1C 6.2 09/18/2013

## 2013-10-15 NOTE — Telephone Encounter (Signed)
Do they alternative long acting nebulized form of beta agonist?

## 2013-10-15 NOTE — Telephone Encounter (Signed)
[facility] does not do nebulizer meds anymore.  They forward all rx's to [facility].  They only have 3 nebulizer meds.  They have the other 2 that we sent in and albuterol.  Melissa said if you wanted pt to have the brovana then all 3 medications will need to be sent to a local pharmacy

## 2013-10-16 MED ORDER — BUDESONIDE 0.25 MG/2ML IN SUSP: 0.2500 mg | Freq: Two times a day (BID) | RESPIRATORY_TRACT | Status: DC

## 2013-10-16 MED ORDER — ARFORMOTEROL TARTRATE 15 MCG/2ML IN NEBU: 15.0000 ug | INHALATION_SOLUTION | Freq: Two times a day (BID) | RESPIRATORY_TRACT | Status: DC

## 2013-10-16 MED ORDER — IPRATROPIUM-ALBUTEROL 0.5-2.5 (3) MG/3ML IN SOLN: 3.0000 mL | Freq: Four times a day (QID) | RESPIRATORY_TRACT | Status: DC | PRN

## 2013-10-16 NOTE — Telephone Encounter (Signed)
Do other companies such as [employer] or other respiratory equipment co still fill prescriptions for nebulized medications?

## 2013-10-16 NOTE — Telephone Encounter (Signed)
Order faxed to [facility]

## 2013-10-21 ENCOUNTER — Telehealth: Payer: Self-pay | Admitting: Internal Medicine

## 2013-10-21 NOTE — Telephone Encounter (Signed)
Pt wants to inform dr. Artist PaisYoo that she wants to use [facility] for her oxygen, pt states she now has [facility] and she does not like the service, states [facility] provided her with her nebulizer and she likes them and she will be able to use them when she goes [place] where her daughter lives

## 2013-10-22 NOTE — Telephone Encounter (Signed)
Will discuss at next office visit

## 2013-10-28 ENCOUNTER — Institutional Professional Consult (permissible substitution): Payer: Medicare Other | Admitting: Pulmonary Disease

## 2013-11-11 ENCOUNTER — Telehealth: Payer: Self-pay | Admitting: Internal Medicine

## 2013-11-11 NOTE — Telephone Encounter (Signed)
Pt requesting refill of [facility]urosemide (LASIX) [facility] [address]  Pt states Dr. Aleen Campiysinger

## 2013-11-15 MED ORDER — FUROSEMIDE 20 MG PO TABS: ORAL_TABLET | ORAL | Status: DC

## 2013-11-15 NOTE — Telephone Encounter (Signed)
Pt following up to see if her rx was [facility], previous message was never sent to you.  Pt also would like to make dr. Artist Paisyoo aware that she does not want to use [facility] for her oxygen, pt wants to use [facility].

## 2013-11-19 ENCOUNTER — Ambulatory Visit (INDEPENDENT_AMBULATORY_CARE_PROVIDER_SITE_OTHER)
Admission: RE | Admit: 2013-11-19 | Discharge: 2013-11-19 | Disposition: A | Discharge status: Home / Self Care | Payer: Medicare Other | Source: Ambulatory Visit | Attending: Internal Medicine | Admitting: Internal Medicine

## 2013-11-19 DIAGNOSIS — J449 Chronic obstructive pulmonary disease, unspecified: Secondary | ICD-10-CM

## 2013-11-20 ENCOUNTER — Encounter: Payer: Self-pay | Admitting: *Deleted

## 2013-11-20 NOTE — Telephone Encounter (Signed)
Order faxed to [facility] to take over oxygen needs

## 2013-11-21 ENCOUNTER — Ambulatory Visit (INDEPENDENT_AMBULATORY_CARE_PROVIDER_SITE_OTHER): Payer: Medicare Other | Admitting: Pulmonary Disease

## 2013-11-21 ENCOUNTER — Encounter: Payer: Self-pay | Admitting: Pulmonary Disease

## 2013-11-21 ENCOUNTER — Ambulatory Visit (INDEPENDENT_AMBULATORY_CARE_PROVIDER_SITE_OTHER): Payer: Medicare Other | Admitting: Internal Medicine

## 2013-11-21 VITALS — BP 144/60 | HR 86 | Ht 63.0 in | Wt 243.0 lb

## 2013-11-21 DIAGNOSIS — F17200 Nicotine dependence, unspecified, uncomplicated: Secondary | ICD-10-CM

## 2013-11-21 DIAGNOSIS — R0609 Other forms of dyspnea: Secondary | ICD-10-CM

## 2013-11-21 DIAGNOSIS — J4489 Other specified chronic obstructive pulmonary disease: Secondary | ICD-10-CM

## 2013-11-21 DIAGNOSIS — I5032 Chronic diastolic (congestive) heart failure: Secondary | ICD-10-CM | POA: Insufficient documentation

## 2013-11-21 DIAGNOSIS — J449 Chronic obstructive pulmonary disease, unspecified: Secondary | ICD-10-CM

## 2013-11-21 DIAGNOSIS — J438 Other emphysema: Secondary | ICD-10-CM

## 2013-11-21 DIAGNOSIS — Z9981 Dependence on supplemental oxygen: Secondary | ICD-10-CM

## 2013-11-21 DIAGNOSIS — R0989 Other specified symptoms and signs involving the circulatory and respiratory systems: Secondary | ICD-10-CM

## 2013-11-21 DIAGNOSIS — R0600 Dyspnea, unspecified: Secondary | ICD-10-CM

## 2013-11-21 LAB — PULMONARY FUNCTION TEST
DL/VA % PRED: 121 %
DL/VA: 5.67 ml/min/mmHg/L
DLCO UNC % PRED: 73 %
DLCO UNC: 16.8 ml/min/mmHg
FEF 25-75 Pre: 0.55 L/sec
FEF2575-%Pred-Pre: 26 %
FEV1-%PRED-PRE: 41 %
FEV1-Pre: 0.96 L
FEV1FVC-%Pred-Pre: 91 %
FEV6-%PRED-PRE: 46 %
FEV6-PRE: 1.36 L
FEV6FVC-%Pred-Pre: 104 %
FVC-%Pred-Pre: 44 %
FVC-PRE: 1.36 L
PRE FEV1/FVC RATIO: 71 %
PRE FEV6/FVC RATIO: 100 %
RV % pred: 158 %
RV: 3.24 L
TLC % pred: 101 %
TLC: 4.98 L

## 2013-11-21 MED ORDER — ARFORMOTEROL TARTRATE 15 MCG/2ML IN NEBU: 15.0000 ug | INHALATION_SOLUTION | Freq: Two times a day (BID) | RESPIRATORY_TRACT | Status: DC

## 2013-11-21 MED ORDER — BUDESONIDE 0.25 MG/2ML IN SUSP: 0.2500 mg | Freq: Two times a day (BID) | RESPIRATORY_TRACT | Status: AC

## 2013-11-21 NOTE — Patient Instructions (Addendum)
Stop Smoking! Try taking your lasix 40mg  a day for the next 3 days to see if it helps with your breathing. We will change your prescription for the Brovana and Pulmicort so that you can get them from [facility]  We will see you back in 3-6 weeks or sooner if needed

## 2013-11-21 NOTE — Progress Notes (Signed)
Subjective:    Patient ID: Aimee Mcclure, female    DOB: [birth year], 63 y.o.   MRN: [id number]  HPI  Aimee Mcclure moved here recently and her health has been deteriorating lately.  She is in the process of getting new doctors.   She was sent to me by her PCP because she has COPD.   She has known that she had COPD in 2006 by a doctor in [place]. Recently she was prescribed brovana and pulmicort but they were too expensive.  After her initial diagnosis of COPD she took Advair and albuterol which really helped a lot.  However she lost her job so she had a stop around age 32.    She has been having recurrent respiratory infections (primarily bronchitis) over the last few years it has been twice per year.  Recently she had a pneumonia shot and this has helped.  She tolerated this without difficulty.  She was last treated with prednisone and antibiotics in May 2015.  She was treated at [facility] [place].    Lately she has been having trouble with wheezing, dyspnea, , she has problems with cough as well.  The cough has been worse in the last 2-3 months.  It is a dry cough that occurs primarily at night.  The cough also occurs after an albuterol treatment.  She coughs up more mucus after using her duoneb.  She typically has white to yellow-gold mucus.    In the last year Aimee Mcclure has been getting more short of breath.  She has been getting inhalers from her doctors offices (samples) including flovent, Advair, and albuterol. She was briefly in a COPD study and received a study drug during that time.    She has had trouble over the years with her "throat closing up" requiring ER visits.  Treatment with acid reflux really helped this.  She has Barrett's esophagus and acid reflux.    She uses oxygen regularly.  She uses a portable oxygen cannister.    Today she had a really hard time with the breathing test.    She currently smokes 15-18 cigarettes a day.  She has used the nicotine patch in the past.   She previously smoked 2.5 packs per day.  She started smoking 46 years ago.    Past Medical History  Diagnosis Date  . COPD (chronic obstructive pulmonary disease)     oxygen dependent  . Prediabetes   . Barrett's esophagus   . Smoker   . GERD (gastroesophageal reflux disease)   . Hypertension   . Vitamin D deficiency   . Obesity      Family History  Problem Relation Age of Onset  . Cancer Father     stomach  . Kidney failure Mother      History   Social History  . Marital Status: Divorced    Spouse Name: N/A    Number of Children: N/A  . Years of Education: N/A   Occupational History  . retired    Social History Main Topics  . Smoking status: Current Every Day Smoker -- 0.50 packs/day for 45 years    Types: Cigarettes  . Smokeless tobacco: Never Used  . Alcohol Use: No  . Drug Use: No  . Sexual Activity: Not on file   Other Topics Concern  . Not on file   Social History Narrative  . No narrative on file     Allergies  Allergen Reactions  . Codeine   . Sulfonamide Derivatives  Outpatient Prescriptions Prior to Visit  Medication Sig Dispense Refill  . albuterol (PROVENTIL HFA;VENTOLIN HFA) 108 (90 BASE) MCG/ACT inhaler Inhale 2 puffs into the lungs every 6 (six) hours as needed for wheezing or shortness of breath.  1 Inhaler  5  . cetirizine (ZYRTEC) 10 MG tablet Take 1 tablet (10 mg total) by mouth daily.  30 tablet  0  . cholecalciferol (VITAMIN D) 1000 UNITS tablet Take 2,000 Units by mouth daily. PATIENT IS TAKING 2,000      . esomeprazole (NEXIUM) 20 MG capsule Take 1 capsule (20 mg total) by mouth daily at 12 noon.      . furosemide (LASIX) 20 MG tablet take 1 tablet by mouth once daily  30 tablet  5  . ipratropium-albuterol (DUONEB) 0.5-2.5 (3) MG/3ML SOLN Take 3 mLs by nebulization every 6 (six) hours as needed.  360 mL  11  . pravastatin (PRAVACHOL) 20 MG tablet Take 1 tablet (20 mg total) by mouth daily.  90 tablet  1  . arformoterol  (BROVANA) 15 MCG/2ML NEBU Take 2 mLs (15 mcg total) by nebulization 2 (two) times daily.  120 mL  11  . budesonide (PULMICORT) 0.25 MG/2ML nebulizer solution Take 2 mLs (0.25 mg total) by nebulization 2 (two) times daily.  60 mL  11   No facility-administered medications prior to visit.      Review of Systems  Constitutional: Negative for fever and unexpected weight change.  HENT: Positive for congestion. Negative for dental problem, ear pain, nosebleeds, postnasal drip, rhinorrhea, sinus pressure, sneezing, sore throat and trouble swallowing.   Eyes: Negative for redness and itching.  Respiratory: Positive for cough, shortness of breath and wheezing. Negative for chest tightness.   Cardiovascular: Positive for palpitations and leg swelling.  Gastrointestinal: Negative for nausea and vomiting.  Genitourinary: Negative for dysuria.  Musculoskeletal: Negative for joint swelling.  Skin: Negative for rash.  Neurological: Positive for headaches.  Hematological: Does not bruise/bleed easily.  Psychiatric/Behavioral: Negative for dysphoric mood. The patient is not nervous/anxious.        Objective:   Physical Exam  Filed Vitals:   11/21/13 1532  BP: 144/60  Pulse: 86  Height: 5\' 3"  (1.6 m)  Weight: 243 lb (110.224 kg)  SpO2: 91%   RA  Gen: chronically ill appearing, no acute distress HEENT: NCAT, PERRL, EOMi, OP clear, neck supple without masses PULM: diminished, wheezing upper lobes, few crackles CV: RRR, no mgr, no JVD AB: BS+, soft, nontender, no hsm Ext: warm, massive pitting edema in legs, no clubbing, no cyanosis Derm: no rash or skin breakdown Neuro: A&Ox4, CN II-XII intact, strength 5/5 in all 4 extremities  July 2015 echocardiogram> normal LV EF, RV normal August 2015 chest x-ray> basilar haziness, likely emphysema     Assessment & Plan:   COPD (chronic obstructive pulmonary disease) COPD: GOLD grade D.  She has very severe COPD which is overall very high  risk. The fact that she has recurrent exacerbations is a poor prognostic sign. This is due to the fact that she continues to smoke and she currently does not take any long acting bronchodilators or inhaled corticosteroid. A big problem for her is medication costs and so we need to help her in that regard.  Plan:  -O2 therapy: Continue 2 L at night -Immunizations: advised flu shot -Tobacco use: Advised at length to quit smoking -Exercise: encouraged regular exercise -Bronchodilator therapy: She has not been taking her long acting bronchodilators because of cost.  Will  change the Rx to come from [facility] for Medicare/insurance purposes and asked her to use albuterol/ipratropium qid prn -Exacerbation prevention: Quit smoking, Brovana/Pulmicort  She may be a good candidate for the Impact study   On home oxygen therapy Today she ambulated 500 feet in the office and oxygen saturation remained above 92% I recommended that she continue using oxygen at night at home  Chronic diastolic congestive heart failure Based on her leg swelling, chest imaging which showed haziness in the bases of the lungs, and normal echocardiogram I think she may have diastolic heart failure.  Plan: -Check pro BNP -I asked her to double the dose of Lasix as her current dose does not make her urinate - Depending on the results of the pro BNP and her response to Lasix we may need to consider adding an ACE inhibitor and beta blocker  TOBACCO ABUSE Counseled at length to quit. She will call [phone] quit now to get free nicotine replacement from the state of [place].    Updated Medication List Outpatient Encounter Prescriptions as of 11/21/2013  Medication Sig  . albuterol (PROVENTIL HFA;VENTOLIN HFA) 108 (90 BASE) MCG/ACT inhaler Inhale 2 puffs into the lungs every 6 (six) hours as needed for wheezing or shortness of breath.  . cetirizine (ZYRTEC) 10 MG tablet Take 1 tablet (10 mg total) by mouth daily.  .  cholecalciferol (VITAMIN D) 1000 UNITS tablet Take 2,000 Units by mouth daily. PATIENT IS TAKING 2,000  . Cyanocobalamin (VITAMIN B 12 PO) Take 1 tablet by mouth daily.  Marland Kitchen. esomeprazole (NEXIUM) 20 MG capsule Take 1 capsule (20 mg total) by mouth daily at 12 noon.  . furosemide (LASIX) 20 MG tablet take 1 tablet by mouth once daily  . ipratropium-albuterol (DUONEB) 0.5-2.5 (3) MG/3ML SOLN Take 3 mLs by nebulization every 6 (six) hours as needed.  . pravastatin (PRAVACHOL) 20 MG tablet Take 1 tablet (20 mg total) by mouth daily.  Marland Kitchen. arformoterol (BROVANA) 15 MCG/2ML NEBU Take 2 mLs (15 mcg total) by nebulization 2 (two) times daily.  . budesonide (PULMICORT) 0.25 MG/2ML nebulizer solution Take 2 mLs (0.25 mg total) by nebulization 2 (two) times daily.

## 2013-11-21 NOTE — Assessment & Plan Note (Addendum)
Based on her leg swelling, chest imaging which showed haziness in the bases of the lungs, and normal echocardiogram I think she may have diastolic heart failure.  Plan: -Check pro BNP -I asked her to double the dose of Lasix as her current dose does not make her urinate - Depending on the results of the pro BNP and her response to Lasix we may need to consider adding an ACE inhibitor and beta blocker

## 2013-11-21 NOTE — Assessment & Plan Note (Signed)
Counseled at length to quit. She will call [phone] quit now to get free nicotine replacement from the state [place].

## 2013-11-21 NOTE — Assessment & Plan Note (Signed)
COPD: GOLD grade D.  She has very severe COPD which is overall very high risk. The fact that she has recurrent exacerbations is a poor prognostic sign. This is due to the fact that she continues to smoke and she currently does not take any long acting bronchodilators or inhaled corticosteroid. A big problem for her is medication costs and so we need to help her in that regard.  Plan:  -O2 therapy: Continue 2 L at night -Immunizations: advised flu shot -Tobacco use: Advised at length to quit smoking -Exercise: encouraged regular exercise -Bronchodilator therapy: She has not been taking her long acting bronchodilators because of cost.  Will change the Rx to come from [facility] for Medicare/insurance purposes and asked her to use albuterol/ipratropium qid prn -Exacerbation prevention: Quit smoking, Brovana/Pulmicort  She may be a good candidate for the Impact study

## 2013-11-21 NOTE — Assessment & Plan Note (Signed)
Today she ambulated 500 feet in the office and oxygen saturation remained above 92% I recommended that she continue using oxygen at night at home

## 2013-11-21 NOTE — Progress Notes (Signed)
PFT done today. 

## 2013-11-22 ENCOUNTER — Other Ambulatory Visit: Payer: Self-pay

## 2013-11-22 ENCOUNTER — Ambulatory Visit: Payer: Medicare Other | Admitting: Internal Medicine

## 2013-11-22 DIAGNOSIS — R0600 Dyspnea, unspecified: Secondary | ICD-10-CM

## 2013-11-22 MED ORDER — ARFORMOTEROL TARTRATE 15 MCG/2ML IN NEBU: 15.0000 ug | INHALATION_SOLUTION | Freq: Two times a day (BID) | RESPIRATORY_TRACT | Status: AC

## 2013-12-15 ENCOUNTER — Emergency Department (HOSPITAL_COMMUNITY): Payer: Medicare Other

## 2013-12-15 ENCOUNTER — Encounter (HOSPITAL_COMMUNITY): Payer: Self-pay | Admitting: Emergency Medicine

## 2013-12-15 ENCOUNTER — Inpatient Hospital Stay (HOSPITAL_COMMUNITY)
Admission: EM | Admit: 2013-12-15 | Discharge: 2013-12-17 | DRG: 392 | Disposition: A | Discharge status: Home / Self Care | Payer: Medicare Other | Attending: Internal Medicine | Admitting: Internal Medicine

## 2013-12-15 DIAGNOSIS — R109 Unspecified abdominal pain: Secondary | ICD-10-CM | POA: Diagnosis not present

## 2013-12-15 DIAGNOSIS — R7309 Other abnormal glucose: Secondary | ICD-10-CM

## 2013-12-15 DIAGNOSIS — L0293 Carbuncle, unspecified: Secondary | ICD-10-CM

## 2013-12-15 DIAGNOSIS — L0292 Furuncle, unspecified: Secondary | ICD-10-CM

## 2013-12-15 DIAGNOSIS — Z9981 Dependence on supplemental oxygen: Secondary | ICD-10-CM

## 2013-12-15 DIAGNOSIS — A088 Other specified intestinal infections: Principal | ICD-10-CM | POA: Diagnosis present

## 2013-12-15 DIAGNOSIS — I10 Essential (primary) hypertension: Secondary | ICD-10-CM | POA: Diagnosis present

## 2013-12-15 DIAGNOSIS — F17200 Nicotine dependence, unspecified, uncomplicated: Secondary | ICD-10-CM | POA: Diagnosis present

## 2013-12-15 DIAGNOSIS — Z79899 Other long term (current) drug therapy: Secondary | ICD-10-CM | POA: Diagnosis not present

## 2013-12-15 DIAGNOSIS — E669 Obesity, unspecified: Secondary | ICD-10-CM | POA: Diagnosis present

## 2013-12-15 DIAGNOSIS — K2270 Barrett's esophagus without dysplasia: Secondary | ICD-10-CM | POA: Diagnosis present

## 2013-12-15 DIAGNOSIS — K529 Noninfective gastroenteritis and colitis, unspecified: Secondary | ICD-10-CM | POA: Diagnosis present

## 2013-12-15 DIAGNOSIS — Z86010 Personal history of colonic polyps: Secondary | ICD-10-CM

## 2013-12-15 DIAGNOSIS — J438 Other emphysema: Secondary | ICD-10-CM

## 2013-12-15 DIAGNOSIS — K219 Gastro-esophageal reflux disease without esophagitis: Secondary | ICD-10-CM | POA: Diagnosis present

## 2013-12-15 DIAGNOSIS — J449 Chronic obstructive pulmonary disease, unspecified: Secondary | ICD-10-CM | POA: Diagnosis present

## 2013-12-15 DIAGNOSIS — R079 Chest pain, unspecified: Secondary | ICD-10-CM

## 2013-12-15 DIAGNOSIS — R0902 Hypoxemia: Secondary | ICD-10-CM

## 2013-12-15 DIAGNOSIS — I5032 Chronic diastolic (congestive) heart failure: Secondary | ICD-10-CM

## 2013-12-15 DIAGNOSIS — Z6841 Body Mass Index (BMI) 40.0 and over, adult: Secondary | ICD-10-CM

## 2013-12-15 DIAGNOSIS — J4489 Other specified chronic obstructive pulmonary disease: Secondary | ICD-10-CM | POA: Diagnosis present

## 2013-12-15 DIAGNOSIS — J441 Chronic obstructive pulmonary disease with (acute) exacerbation: Secondary | ICD-10-CM

## 2013-12-15 DIAGNOSIS — R062 Wheezing: Secondary | ICD-10-CM

## 2013-12-15 LAB — CBC WITH DIFFERENTIAL/PLATELET
Basophils Absolute: 0 K/uL (ref 0.0–0.1)
Basophils Relative: 0 % (ref 0–1)
Eosinophils Absolute: 0.1 K/uL (ref 0.0–0.7)
Eosinophils Relative: 1 % (ref 0–5)
HCT: 50.8 % — ABNORMAL HIGH (ref 36.0–46.0)
Hemoglobin: 16.7 g/dL — ABNORMAL HIGH (ref 12.0–15.0)
Lymphocytes Relative: 19 % (ref 12–46)
Lymphs Abs: 2.1 K/uL (ref 0.7–4.0)
MCH: 27.5 pg (ref 26.0–34.0)
MCHC: 32.9 g/dL (ref 30.0–36.0)
MCV: 83.6 fL (ref 78.0–100.0)
Monocytes Absolute: 0.8 K/uL (ref 0.1–1.0)
Monocytes Relative: 7 % (ref 3–12)
Neutro Abs: 8.4 K/uL — ABNORMAL HIGH (ref 1.7–7.7)
Neutrophils Relative %: 73 % (ref 43–77)
Platelets: 211 K/uL (ref 150–400)
RBC: 6.08 MIL/uL — ABNORMAL HIGH (ref 3.87–5.11)
RDW: 15.4 % (ref 11.5–15.5)
WBC: 11.4 K/uL — ABNORMAL HIGH (ref 4.0–10.5)

## 2013-12-15 LAB — LIPASE, BLOOD: Lipase: 14 U/L (ref 11–59)

## 2013-12-15 LAB — COMPREHENSIVE METABOLIC PANEL WITH GFR
ALT: 12 U/L (ref 0–35)
AST: 11 U/L (ref 0–37)
Albumin: 3.7 g/dL (ref 3.5–5.2)
Alkaline Phosphatase: 90 U/L (ref 39–117)
Anion gap: 13 (ref 5–15)
BUN: 11 mg/dL (ref 6–23)
CO2: 30 meq/L (ref 19–32)
Calcium: 9.1 mg/dL (ref 8.4–10.5)
Chloride: 95 meq/L — ABNORMAL LOW (ref 96–112)
Creatinine, Ser: 0.74 mg/dL (ref 0.50–1.10)
GFR calc Af Amer: >90 mL/min
GFR calc non Af Amer: 87 mL/min — ABNORMAL LOW
Glucose, Bld: 135 mg/dL — ABNORMAL HIGH (ref 70–99)
Potassium: 3.7 meq/L (ref 3.7–5.3)
Sodium: 138 meq/L (ref 137–147)
Total Bilirubin: 0.8 mg/dL (ref 0.3–1.2)
Total Protein: 7.5 g/dL (ref 6.0–8.3)

## 2013-12-15 LAB — URINE MICROSCOPIC-ADD ON

## 2013-12-15 LAB — URINALYSIS, ROUTINE W REFLEX MICROSCOPIC
GLUCOSE, UA: NEGATIVE mg/dL
Hgb urine dipstick: NEGATIVE
KETONES UR: NEGATIVE mg/dL
Nitrite: NEGATIVE
PH: 6 (ref 5.0–8.0)
Protein, ur: 30 mg/dL — AB
Specific Gravity, Urine: 1.02 (ref 1.005–1.030)
Urobilinogen, UA: 0.2 mg/dL (ref 0.0–1.0)

## 2013-12-15 LAB — OCCULT BLOOD X 1 CARD TO LAB, STOOL: Fecal Occult Bld: NEGATIVE

## 2013-12-15 MED ORDER — IOHEXOL 300 MG/ML  SOLN
50.0000 mL | Freq: Once | INTRAMUSCULAR | Status: AC | PRN
  Administered 2013-12-15: 50 mL via ORAL

## 2013-12-15 MED ORDER — SODIUM CHLORIDE 0.9 % IV BOLUS (SEPSIS)
1000.0000 mL | Freq: Once | INTRAVENOUS | Status: AC
  Administered 2013-12-15: 1000 mL via INTRAVENOUS

## 2013-12-15 MED ORDER — ONDANSETRON HCL 4 MG/2ML IJ SOLN
4.0000 mg | Freq: Once | INTRAMUSCULAR | Status: AC
  Administered 2013-12-15: 4 mg via INTRAVENOUS
  Filled 2013-12-15: qty 2

## 2013-12-15 MED ORDER — HYDROMORPHONE HCL PF 1 MG/ML IJ SOLN
1.0000 mg | Freq: Once | INTRAMUSCULAR | Status: AC
  Administered 2013-12-15: 1 mg via INTRAVENOUS
  Filled 2013-12-15: qty 1

## 2013-12-15 MED ORDER — METRONIDAZOLE IN NACL 5-0.79 MG/ML-% IV SOLN
500.0000 mg | Freq: Once | INTRAVENOUS | Status: AC
  Administered 2013-12-16: 500 mg via INTRAVENOUS
  Filled 2013-12-15: qty 100

## 2013-12-15 MED ORDER — CIPROFLOXACIN IN D5W 400 MG/200ML IV SOLN
400.0000 mg | Freq: Once | INTRAVENOUS | Status: AC
Start: 2013-12-15 — End: 2013-12-16
  Administered 2013-12-16: 400 mg via INTRAVENOUS
  Filled 2013-12-15: qty 200

## 2013-12-15 MED ORDER — IOHEXOL 300 MG/ML  SOLN
100.0000 mL | Freq: Once | INTRAMUSCULAR | Status: AC | PRN
  Administered 2013-12-15: 100 mL via INTRAVENOUS

## 2013-12-15 NOTE — ED Provider Notes (Signed)
CSN: [id number]     Arrival date & time 12/15/13  2032 History   First MD Initiated Contact with Patient 12/15/13 2111     Chief Complaint  Patient presents with  . Abdominal Pain     (Consider location/radiation/quality/duration/timing/severity/associated sxs/prior Treatment) HPI 63 y.o. Female with nausea, diarrhea, and abdominal pain that began 2 days ago.  She has had cold sweats but no fever. She has had decrease po as food makes her stomach hurt worse. She has had previous cholecystectomy.  She has had four loose stools in the past 24 hours.  No blood or dark stool noted.    Past Medical History  Diagnosis Date  . COPD (chronic obstructive pulmonary disease)     oxygen dependent  . Prediabetes   . Barrett's esophagus   . Smoker   . GERD (gastroesophageal reflux disease)   . Hypertension   . Vitamin D deficiency   . Obesity    Past Surgical History  Procedure Laterality Date  . Abdominal hysterectomy      partial  . Oophorectomy    . Laparoscopy      abdomen/pelvis  . Cholecystectomy    . Appendectomy  1985   Family History  Problem Relation Age of Onset  . Cancer Father     stomach  . Kidney failure Mother    History  Substance Use Topics  . Smoking status: Current Every Day Smoker -- 0.50 packs/day for 45 years    Types: Cigarettes  . Smokeless tobacco: Never Used  . Alcohol Use: No   OB History   Grav Para Term Preterm Abortions TAB SAB Ect Mult Living                 Review of Systems  All other systems reviewed and are negative.     Allergies  Sulfonamide derivatives and Codeine  Home Medications   Prior to Admission medications   Medication Sig Start Date End Date Taking? Authorizing Provider  albuterol (PROVENTIL HFA;VENTOLIN HFA) 108 (90 BASE) MCG/ACT inhaler Inhale 2 puffs into the lungs every 6 (six) hours as needed for wheezing or shortness of breath. 10/14/13  Yes Doe-Hyun R Artist Pais, DO  arformoterol (BROVANA) 15 MCG/2ML NEBU Take 2 mLs (15  mcg total) by nebulization 2 (two) times daily. 11/22/13  Yes Lupita Leash, MD  budesonide (PULMICORT) 0.25 MG/2ML nebulizer solution Take 2 mLs (0.25 mg total) by nebulization 2 (two) times daily. 11/21/13  Yes Lupita Leash, MD  cetirizine (ZYRTEC) 10 MG tablet Take 1 tablet (10 mg total) by mouth daily. 08/02/11 09/19/14 Yes Kermit Balo Tysinger, PA-C  cholecalciferol (VITAMIN D) 1000 UNITS tablet Take 2,000 Units by mouth daily. PATIENT IS TAKING 2,000   Yes Historical Provider, MD  Cyanocobalamin (VITAMIN B 12 PO) Take 1 tablet by mouth daily.   Yes Historical Provider, MD  esomeprazole (NEXIUM) 20 MG capsule Take 1 capsule (20 mg total) by mouth daily at 12 noon. 10/14/13  Yes Doe-Hyun Sherran Needs, DO  furosemide (LASIX) 20 MG tablet take 1 tablet by mouth once daily 11/15/13  Yes Doe-Hyun R Yoo, DO  pravastatin (PRAVACHOL) 20 MG tablet Take 1 tablet (20 mg total) by mouth daily. 10/14/13  Yes Doe-Hyun R Yoo, DO   BP 163/76  Pulse 102  Temp(Src) 98.9 F (37.2 C) (Rectal)  Resp 18  SpO2 92% Physical Exam  Nursing note and vitals reviewed. Constitutional: She is oriented to person, place, and time. She appears well-developed and well-nourished.  HENT:  Head: Normocephalic and atraumatic.  Right Ear: External ear normal.  Left Ear: External ear normal.  Eyes: Conjunctivae and EOM are normal. Pupils are equal, round, and reactive to light.  Neck: Normal range of motion. Neck supple.  Cardiovascular: Normal rate, regular rhythm, normal heart sounds and intact distal pulses.   Pulmonary/Chest: She has no rales.  Breath sounds decreased  Abdominal: Soft. Bowel sounds are normal. There is tenderness.  Moderate diffuse ttp across upper abdomen.   Musculoskeletal: Normal range of motion. She exhibits no edema.  Neurological: She is alert and oriented to person, place, and time. She has normal reflexes.  Skin: Skin is warm and dry.  Psychiatric: She has a normal mood and affect. Judgment and thought  content normal.    ED Course  Procedures (including critical care time) Labs Review Labs Reviewed  CBC WITH DIFFERENTIAL - Abnormal; Notable for the following:    WBC 11.4 (*)    RBC 6.08 (*)    Hemoglobin 16.7 (*)    HCT 50.8 (*)    Neutro Abs 8.4 (*)    All other components within normal limits  URINALYSIS, ROUTINE W REFLEX MICROSCOPIC - Abnormal; Notable for the following:    Color, Urine AMBER (*)    APPearance CLOUDY (*)    Bilirubin Urine MODERATE (*)    Protein, ur 30 (*)    Leukocytes, UA SMALL (*)    All other components within normal limits  URINE MICROSCOPIC-ADD ON - Abnormal; Notable for the following:    Squamous Epithelial / LPF MANY (*)    Bacteria, UA MANY (*)    Casts HYALINE CASTS (*)    All other components within normal limits  CLOSTRIDIUM DIFFICILE BY PCR  COMPREHENSIVE METABOLIC PANEL  LIPASE, BLOOD  OCCULT BLOOD X 1 CARD TO LAB, STOOL    Imaging Review Ct Abdomen Pelvis W Contrast  12/15/2013   CLINICAL DATA:  Right upper quadrant pain, diarrhea  EXAM: CT ABDOMEN AND PELVIS WITH CONTRAST  TECHNIQUE: Multidetector CT imaging of the abdomen and pelvis was performed using the standard protocol following bolus administration of intravenous contrast.  CONTRAST:  OMNIPAQUE IOHEXOL 300 MG/ML  SOLN  COMPARISON:  None.  FINDINGS: Lung bases are clear.  Liver, spleen, pancreas, and adrenal glands are within normal limits.  Status post cholecystectomy. No intrahepatic or extrahepatic ductal dilatation.  Kidneys within normal limits.  No hydronephrosis.  No evidence of bowel obstruction. Prior appendectomy. Wall thickening involving distal small bowel/ ileum with associated mesenteric stranding (series 2/ image 65), suggesting infectious/inflammatory enteritis.  Atherosclerotic calcifications of the abdominal aorta and branch vessels.  Small volume pelvic ascites.  No drainable fluid collection/abscess.  No free air.  Status post hysterectomy.  No adnexal masses.   Bladder is within normal limits.  Mild degenerative changes of the lower thoracic spine.  IMPRESSION: Wall thickening involving distal small bowel/ileum, suggesting infectious/ inflammatory enteritis.  Associated small volume pelvic ascites. No drainable fluid collection/ abscess. No free air.  No evidence of bowel obstruction.   Electronically Signed   By: Charline Bills M.D.   On: 12/15/2013 23:34   Dg Chest Port 1 View  12/15/2013   CLINICAL DATA:  Chest pain and upper abdominal pain.  EXAM: PORTABLE CHEST - 1 VIEW  COMPARISON:  11/19/2013 and 05/18/2012  FINDINGS: Lungs are adequately inflated with bibasilar hazy density without significant change from the prior exams likely due to overlying soft tissues and vasculature. Stable borderline cardiomegaly. Remainder of  the exam is unchanged.  IMPRESSION: No active disease.   Electronically Signed   By: Elberta Fortis M.D.   On: 12/15/2013 21:44     EKG Interpretation None      MDM   Final diagnoses:  Enteritis  63 y.o. Female presents with abdominal pain and diarrhea who has enteritis. Patient being treated with cipro and flagyl here.    Discussed with Dr. Rito Ehrlich and plan admission to med surg bed.   Hilario Quarry, MD 12/15/13 2350

## 2013-12-15 NOTE — ED Notes (Addendum)
Per EMS pt reports having RUQ abdominal pain since yesterday after eating ice cream around 4pm. Pain has continued. Pt states pain radiates to epigastric area. Pt states she has had x7 episodes of diarrhea. Pt is tender to RUQ and states she feels distended and is soft upon palpation. Pt has hx of COPD and stays in lower 90s only uses oxygen at night. Pt comes from home is able to ambulate.

## 2013-12-15 NOTE — ED Notes (Signed)
Bed: MV78 Expected date:  Expected time:  Means of arrival:  Comments: EMS/63 yo female RUQ pain since yesterday-pain increases with movement/radiates in to chest

## 2013-12-16 ENCOUNTER — Encounter (HOSPITAL_COMMUNITY): Payer: Self-pay | Admitting: Internal Medicine

## 2013-12-16 DIAGNOSIS — K5289 Other specified noninfective gastroenteritis and colitis: Secondary | ICD-10-CM

## 2013-12-16 DIAGNOSIS — F17200 Nicotine dependence, unspecified, uncomplicated: Secondary | ICD-10-CM

## 2013-12-16 DIAGNOSIS — J449 Chronic obstructive pulmonary disease, unspecified: Secondary | ICD-10-CM

## 2013-12-16 LAB — CBC
HCT: 51.4 % — ABNORMAL HIGH (ref 36.0–46.0)
Hemoglobin: 16.2 g/dL — ABNORMAL HIGH (ref 12.0–15.0)
MCH: 27.5 pg (ref 26.0–34.0)
MCHC: 31.5 g/dL (ref 30.0–36.0)
MCV: 87.1 fL (ref 78.0–100.0)
PLATELETS: 214 10*3/uL (ref 150–400)
RBC: 5.9 MIL/uL — ABNORMAL HIGH (ref 3.87–5.11)
RDW: 15.3 % (ref 11.5–15.5)
WBC: 9.8 10*3/uL (ref 4.0–10.5)

## 2013-12-16 LAB — COMPREHENSIVE METABOLIC PANEL
ALK PHOS: 91 U/L (ref 39–117)
ALT: 11 U/L (ref 0–35)
AST: 10 U/L (ref 0–37)
Albumin: 3.4 g/dL — ABNORMAL LOW (ref 3.5–5.2)
Anion gap: 10 (ref 5–15)
BILIRUBIN TOTAL: 0.4 mg/dL (ref 0.3–1.2)
BUN: 11 mg/dL (ref 6–23)
CHLORIDE: 95 meq/L — AB (ref 96–112)
CO2: 32 meq/L (ref 19–32)
CREATININE: 0.6 mg/dL (ref 0.50–1.10)
Calcium: 8.9 mg/dL (ref 8.4–10.5)
GFR calc Af Amer: >90 mL/min (ref >90)
Glucose, Bld: 144 mg/dL — ABNORMAL HIGH (ref 70–99)
Potassium: 4.5 mEq/L (ref 3.7–5.3)
Sodium: 137 mEq/L (ref 137–147)
Total Protein: 7.6 g/dL (ref 6.0–8.3)

## 2013-12-16 LAB — CLOSTRIDIUM DIFFICILE BY PCR: Toxigenic C. Difficile by PCR: NEGATIVE

## 2013-12-16 MED ORDER — ALBUTEROL SULFATE (2.5 MG/3ML) 0.083% IN NEBU: 2.5000 mg | INHALATION_SOLUTION | RESPIRATORY_TRACT | Status: DC | PRN

## 2013-12-16 MED ORDER — ACETAMINOPHEN 325 MG PO TABS
650.0000 mg | ORAL_TABLET | Freq: Four times a day (QID) | ORAL | Status: DC | PRN
  Administered 2013-12-16 (×2): 650 mg via ORAL
  Filled 2013-12-16 (×2): qty 2

## 2013-12-16 MED ORDER — ONDANSETRON HCL 4 MG PO TABS: 4.0000 mg | ORAL_TABLET | Freq: Four times a day (QID) | ORAL | Status: DC | PRN

## 2013-12-16 MED ORDER — ENOXAPARIN SODIUM 60 MG/0.6ML ~~LOC~~ SOLN
55.0000 mg | Freq: Every day | SUBCUTANEOUS | Status: DC
  Administered 2013-12-16 – 2013-12-17 (×2): 55 mg via SUBCUTANEOUS
  Filled 2013-12-16 (×2): qty 0.6

## 2013-12-16 MED ORDER — OXYCODONE HCL 5 MG PO TABS: 5.0000 mg | ORAL_TABLET | ORAL | Status: DC | PRN

## 2013-12-16 MED ORDER — NICOTINE 14 MG/24HR TD PT24
14.0000 mg | MEDICATED_PATCH | Freq: Every day | TRANSDERMAL | Status: DC
  Filled 2013-12-16 (×2): qty 1

## 2013-12-16 MED ORDER — SODIUM CHLORIDE 0.9 % IV SOLN
INTRAVENOUS | Status: AC
  Administered 2013-12-16: 05:00:00 via INTRAVENOUS

## 2013-12-16 MED ORDER — ONDANSETRON HCL 4 MG/2ML IJ SOLN
4.0000 mg | Freq: Four times a day (QID) | INTRAMUSCULAR | Status: DC | PRN
  Administered 2013-12-16: 4 mg via INTRAVENOUS
  Filled 2013-12-16: qty 2

## 2013-12-16 MED ORDER — IPRATROPIUM-ALBUTEROL 0.5-2.5 (3) MG/3ML IN SOLN
3.0000 mL | Freq: Four times a day (QID) | RESPIRATORY_TRACT | Status: DC
Start: 2013-12-16 — End: 2013-12-16
  Administered 2013-12-16: 3 mL via RESPIRATORY_TRACT
  Filled 2013-12-16: qty 3

## 2013-12-16 MED ORDER — HYDROMORPHONE HCL PF 1 MG/ML IJ SOLN
0.5000 mg | INTRAMUSCULAR | Status: DC | PRN
  Administered 2013-12-16 – 2013-12-17 (×4): 0.5 mg via INTRAVENOUS
  Filled 2013-12-16 (×4): qty 1

## 2013-12-16 MED ORDER — CIPROFLOXACIN IN D5W 400 MG/200ML IV SOLN
400.0000 mg | Freq: Two times a day (BID) | INTRAVENOUS | Status: DC
  Administered 2013-12-16 – 2013-12-17 (×3): 400 mg via INTRAVENOUS
  Filled 2013-12-16 (×3): qty 200

## 2013-12-16 MED ORDER — IPRATROPIUM BROMIDE 0.02 % IN SOLN
0.5000 mg | Freq: Four times a day (QID) | RESPIRATORY_TRACT | Status: DC
  Administered 2013-12-16 (×3): 0.5 mg via RESPIRATORY_TRACT
  Filled 2013-12-16 (×3): qty 2.5

## 2013-12-16 MED ORDER — ALBUTEROL SULFATE (2.5 MG/3ML) 0.083% IN NEBU
2.5000 mg | INHALATION_SOLUTION | Freq: Four times a day (QID) | RESPIRATORY_TRACT | Status: DC
  Administered 2013-12-16 (×3): 2.5 mg via RESPIRATORY_TRACT
  Filled 2013-12-16 (×2): qty 3

## 2013-12-16 MED ORDER — SIMVASTATIN 10 MG PO TABS
10.0000 mg | ORAL_TABLET | Freq: Every day | ORAL | Status: DC
  Administered 2013-12-16: 10 mg via ORAL
  Filled 2013-12-16 (×2): qty 1

## 2013-12-16 MED ORDER — ARFORMOTEROL TARTRATE 15 MCG/2ML IN NEBU
15.0000 ug | INHALATION_SOLUTION | Freq: Two times a day (BID) | RESPIRATORY_TRACT | Status: DC
  Administered 2013-12-16 – 2013-12-17 (×3): 15 ug via RESPIRATORY_TRACT
  Filled 2013-12-16 (×5): qty 2

## 2013-12-16 MED ORDER — METRONIDAZOLE IN NACL 5-0.79 MG/ML-% IV SOLN
500.0000 mg | Freq: Three times a day (TID) | INTRAVENOUS | Status: DC
  Administered 2013-12-16 – 2013-12-17 (×4): 500 mg via INTRAVENOUS
  Filled 2013-12-16 (×5): qty 100

## 2013-12-16 MED ORDER — ACETAMINOPHEN 650 MG RE SUPP: 650.0000 mg | Freq: Four times a day (QID) | RECTAL | Status: DC | PRN

## 2013-12-16 MED ORDER — IPRATROPIUM-ALBUTEROL 0.5-2.5 (3) MG/3ML IN SOLN
3.0000 mL | Freq: Three times a day (TID) | RESPIRATORY_TRACT | Status: DC
Start: 2013-12-17 — End: 2013-12-17
  Filled 2013-12-16: qty 3

## 2013-12-16 MED ORDER — BUDESONIDE 0.25 MG/2ML IN SUSP
0.2500 mg | Freq: Two times a day (BID) | RESPIRATORY_TRACT | Status: DC
  Administered 2013-12-16 – 2013-12-17 (×3): 0.25 mg via RESPIRATORY_TRACT
  Filled 2013-12-16 (×8): qty 2

## 2013-12-16 MED ORDER — ENOXAPARIN SODIUM 40 MG/0.4ML ~~LOC~~ SOLN: 40.0000 mg | SUBCUTANEOUS | Status: DC

## 2013-12-16 MED ORDER — PANTOPRAZOLE SODIUM 40 MG PO TBEC
40.0000 mg | DELAYED_RELEASE_TABLET | Freq: Every day | ORAL | Status: DC
  Administered 2013-12-16 – 2013-12-17 (×2): 40 mg via ORAL
  Filled 2013-12-16 (×2): qty 1

## 2013-12-16 NOTE — Care Management Note (Signed)
    Page 1 of 1   12/16/2013     2:38:39 PM CARE MANAGEMENT NOTE 12/16/2013  Patient:  Aimee Mcclure, Aimee Mcclure   Account Number:  [id number]  Date Initiated:  12/16/2013  Documentation initiated by:  Lanier Clam  Subjective/Objective Assessment:   63 Y/O F ADMITTED W/ENTERITIS.     Action/Plan:   FROM HOME.   Anticipated DC Date:  12/19/2013   Anticipated DC Plan:  HOME/SELF CARE         Choice offered to / List presented to:             Status of service:  In process, will continue to follow Medicare Important Message given?   (If response is "NO", the following Medicare IM given date fields will be blank) Date Medicare IM given:   Medicare IM given by:   Date Additional Medicare IM given:   Additional Medicare IM given by:    Discharge Disposition:    Per UR Regulation:  Reviewed for med. necessity/level of care/duration of stay  If discussed at Long Length of Stay Meetings, dates discussed:    Comments:  12/16/13 Garry Nicolini RN,BSN NCM [phone] NO ANTICIPATED D/C NEEDS.

## 2013-12-16 NOTE — Progress Notes (Signed)
Utilization review completed.  

## 2013-12-16 NOTE — Progress Notes (Signed)
OT Cancellation Note  Patient Details Name: Aimee Mcclure MRN: [id number] DOB: 02-Jun-1950   Cancelled Treatment:    Reason Eval/Treat Not Completed: Other (comment).  Pt just had pain medication and is groggy.  Will check back tomorrow.  Kassadi Presswood 12/16/2013, 2:49 PM Marica Otter, OTR/L [phone] 12/16/2013

## 2013-12-16 NOTE — Progress Notes (Signed)
Rx Brief Lovenox  Note  Pt with Wt=111 kg  BMI=43 ordered Lovenox  daily Rx adjusted to Lovenox  SQ daily for DVT prophylaxis in pt with BMI>30  Lorenza Evangelist 12/16/2013 1:31 AM

## 2013-12-16 NOTE — Evaluation (Signed)
Physical Therapy Evaluation Patient Details Name: Aimee Mcclure MRN: [id number] DOB: May 08, 1950 Today's Date: 12/16/2013   History of Present Illness   Pt admitted with nausea, diarrhea and RUQ pain x 2 days.  Clinical Impression  Pt pleasant, cooperative and happy to be able to walk.  Pt currently requiring use of RW, min guard assist and increased time 2* abdominal pain and SOB to mobilize.  With resolution of pain, pt plans d/c to home with intermittent assist of family.    Follow Up Recommendations No PT follow up    Equipment Recommendations  None recommended by PT (Pt states she can borrow RW from friend)    Recommendations for Other Services       Precautions / Restrictions Precautions Precautions: Fall Restrictions Weight Bearing Restrictions: No      Mobility  Bed Mobility Overal bed mobility: Modified Independent                Transfers Overall transfer level: Needs assistance Equipment used: Rolling walker (2 wheeled) Transfers: Sit to/from Stand Sit to Stand: Min guard         General transfer comment: cues for use of UEs to self assist  Ambulation/Gait Ambulation/Gait assistance: Min assist;Min guard Ambulation Distance (Feet): 200 Feet Assistive device: Rolling walker (2 wheeled) Gait Pattern/deviations: Step-through pattern;Decreased step length - right;Decreased step length - left;Shuffle;Trunk flexed Gait velocity: decreased with multiple short rests 2* fatigue/SOB   General Gait Details: Attempted ambulatioin sans AD - pt states "I cant, Im just too weak."  Pt ambulated with RW and cues for posture, pacing and position from [employer]            Wheelchair Mobility    Modified Rankin (Stroke Patients Only)       Balance                                             Pertinent Vitals/Pain Pain Assessment: 0-10 Pain Score: 7  Pain Location: lower abdomen Pain Descriptors / Indicators: Cramping;Aching Pain  Intervention(s): Limited activity within patient's tolerance;Monitored during session;Patient requesting pain meds-RN notified    Home Living Family/patient expects to be discharged to:: Private residence Living Arrangements: Alone Available Help at Discharge: Family Type of Home: Apartment Home Access: Level entry     Home Layout: One level Home Equipment: None Additional Comments: Pt states she is in process of moving in with dtr.  Pt states she can borrow RW from friend    Prior Function Level of Independence: Independent               Hand Dominance        Extremity/Trunk Assessment   Upper Extremity Assessment: Overall WFL for tasks assessed           Lower Extremity Assessment: Overall WFL for tasks assessed         Communication   Communication: No difficulties  Cognition Arousal/Alertness: Awake/alert Behavior During Therapy: WFL for tasks assessed/performed Overall Cognitive Status: Within Functional Limits for tasks assessed                      General Comments      Exercises        Assessment/Plan    PT Assessment Patient needs continued PT services  PT Diagnosis Difficulty walking   PT Problem List Decreased activity tolerance;Decreased  mobility;Decreased knowledge of use of DME;Obesity;Pain  PT Treatment Interventions DME instruction;Gait training;Functional mobility training;Therapeutic activities;Therapeutic exercise;Patient/family education   PT Goals (Current goals can be found in the Care Plan section) Acute Rehab PT Goals Patient Stated Goal: Resume previous lifestyle with decreased pain PT Goal Formulation: With patient Time For Goal Achievement: 12/23/13 Potential to Achieve Goals: Good    Frequency Min 3X/week   Barriers to discharge        Co-evaluation               End of Session   Activity Tolerance: Patient tolerated treatment well;Patient limited by fatigue;Patient limited by pain Patient left:  in bed;with call bell/phone within reach;with nursing/sitter in room Nurse Communication: Mobility status         Time: 1610-9604 PT Time Calculation (min): 23 min   Charges:   PT Evaluation $Initial PT Evaluation Tier I: 1 Procedure PT Treatments $Gait Training: 8-22 mins   PT G Codes:          Carlee Vonderhaar 12/16/2013, 1:43 PM

## 2013-12-16 NOTE — H&P (Signed)
[facility] History and Physical  Aberdeen Hafen TMA:[id number] DOB: 05-07-1950 DOA: 12/15/2013   PCP: Drema Pry, DO  Specialists: Dr. Leroy Kennedy is her gastroenterologist  Chief Complaint: Abdominal pain with diarrhea since yesterday  HPI: Aimee Mcclure is a 63 y.o. female with a past medical history of COPD, who presents with abdominal pain, and diarrhea. The symptoms started yesterday. She tells me that 2 days ago, she ate at [employer] and made herself a chicken sandwich with chicken brought from a deli. No sick contacts. She's had about 10 episodes of loose, watery stool. Denies any blood in the stool. She's had cold sweats, but denies any fever or chills. She's had right-sided abdominal pain, which is 10 out of 10 in intensity, sharp, and then occasionally dull without any radiation. No dizziness or lightheadedness. She uses oxygen at home for her COPD. She denies any nausea, vomiting. She's had upper endoscopy in 2007 which showed possible Barrett's esophagus. It appears, that she had a colonoscopy in 2009 which showed polyps in the rectal area. History is limited as the patient keeps nodding off due to narcotics.  Home Medications: Prior to Admission medications   Medication Sig Start Date End Date Taking? Authorizing Provider  albuterol (PROVENTIL HFA;VENTOLIN HFA) 108 (90 BASE) MCG/ACT inhaler Inhale 2 puffs into the lungs every 6 (six) hours as needed for wheezing or shortness of breath. 10/14/13  Yes Doe-Hyun R Shawna Orleans, DO  arformoterol (BROVANA) 15 MCG/2ML NEBU Take 2 mLs (15 mcg total) by nebulization 2 (two) times daily. 11/22/13  Yes Juanito Doom, MD  budesonide (PULMICORT) 0.25 MG/2ML nebulizer solution Take 2 mLs (0.25 mg total) by nebulization 2 (two) times daily. 11/21/13  Yes Juanito Doom, MD  cetirizine (ZYRTEC) 10 MG tablet Take 1 tablet (10 mg total) by mouth daily. 08/02/11 09/19/14 Yes Camelia Eng Tysinger, PA-C  cholecalciferol (VITAMIN D) 1000 UNITS tablet Take 2,000  Units by mouth daily. PATIENT IS TAKING 2,000   Yes Historical Provider, MD  Cyanocobalamin (VITAMIN B 12 PO) Take 1 tablet by mouth daily.   Yes Historical Provider, MD  esomeprazole (NEXIUM) 20 MG capsule Take 1 capsule (20 mg total) by mouth daily at 12 noon. 10/14/13  Yes Doe-Hyun Kyra Searles, DO  furosemide (LASIX) 20 MG tablet take 1 tablet by mouth once daily 11/15/13  Yes Doe-Hyun R Yoo, DO  pravastatin (PRAVACHOL) 20 MG tablet Take 1 tablet (20 mg total) by mouth daily. 10/14/13  Yes Doe-Hyun Kyra Searles, DO    Allergies:  Allergies  Allergen Reactions  . Sulfonamide Derivatives Anaphylaxis  . Codeine     "passed out"    Past Medical History: Past Medical History  Diagnosis Date  . COPD (chronic obstructive pulmonary disease)     oxygen dependent  . Prediabetes   . Barrett's esophagus   . Smoker   . GERD (gastroesophageal reflux disease)   . Hypertension   . Vitamin D deficiency   . Obesity     Past Surgical History  Procedure Laterality Date  . Abdominal hysterectomy      partial  . Oophorectomy    . Laparoscopy      abdomen/pelvis  . Cholecystectomy    . Appendectomy  1985    Social History: She lives alone. Smokes half-pack cigarettes a daily basis. Denies any alcohol use. No illicit drug use. Independent with daily activities.  Family History:  Family History  Problem Relation Age of Onset  . Cancer Father     stomach  .  Kidney failure Mother      Review of Systems - unable to obtain. She is somewhat drowsy from medication effect  Physical Examination  Filed Vitals:   12/15/13 2035 12/15/13 2151 12/16/13 0022  BP: 163/76  106/69  Pulse: 102  88  Temp: 98.3 F (36.8 C) 98.9 F (37.2 C) 98.1 F (36.7 C)  TempSrc: Oral Rectal Oral  Resp: 18  18  SpO2: 92%  94%    BP 106/69  Pulse 88  Temp(Src) 98.1 F (36.7 C) (Oral)  Resp 18  SpO2 94%  General appearance: alert, cooperative, appears stated age, distracted, no distress and morbidly obese Head:  Normocephalic, without obvious abnormality, atraumatic Eyes: conjunctivae/corneas clear. PERRL, EOM's intact.  Throat: dry mm Neck: no adenopathy, no carotid bruit, no JVD, supple, symmetrical, trachea midline and thyroid not enlarged, symmetric, no tenderness/mass/nodules Resp: Few end expiratory wheezing bilaterally. No crackles Cardio: regular rate and rhythm, S1, S2 normal, no murmur, click, rub or gallop GI: Soft. Tender in the right lower quadrant without rebound, rigidity, or guarding. No masses, organomegaly. Bowel sounds are present. Extremities: extremities normal, atraumatic, no cyanosis or edema Pulses: 2+ and symmetric Skin: Skin color, texture, turgor normal. No rashes or lesions Lymph nodes: Cervical, supraclavicular, and axillary nodes normal. Neurologic: She is alert. Somnolent. Easily arousable. Oriented to place, person, and time. No focal neurological deficits  Laboratory Data: Results for orders placed during the hospital encounter of 12/15/13 (from the past 48 hour(s))  URINALYSIS, ROUTINE W REFLEX MICROSCOPIC     Status: Abnormal   Collection Time    12/15/13  9:50 PM      Result Value Ref Range   Color, Urine AMBER (*) YELLOW   Comment: BIOCHEMICALS MAY BE AFFECTED BY COLOR   APPearance CLOUDY (*) CLEAR   Specific Gravity, Urine 1.020  1.005 - 1.030   pH 6.0  5.0 - 8.0   Glucose, UA NEGATIVE  NEGATIVE mg/dL   Hgb urine dipstick NEGATIVE  NEGATIVE   Bilirubin Urine MODERATE (*) NEGATIVE   Ketones, ur NEGATIVE  NEGATIVE mg/dL   Protein, ur 30 (*) NEGATIVE mg/dL   Urobilinogen, UA 0.2  0.0 - 1.0 mg/dL   Nitrite NEGATIVE  NEGATIVE   Leukocytes, UA SMALL (*) NEGATIVE  URINE MICROSCOPIC-ADD ON     Status: Abnormal   Collection Time    12/15/13  9:50 PM      Result Value Ref Range   Squamous Epithelial / LPF MANY (*) RARE   WBC, UA 3-6  <3 WBC/hpf   RBC / HPF 0-2  <3 RBC/hpf   Bacteria, UA MANY (*) RARE   Casts HYALINE CASTS (*) NEGATIVE   Urine-Other MUCOUS  PRESENT    OCCULT BLOOD X 1 CARD TO LAB, STOOL     Status: None   Collection Time    12/15/13 10:19 PM      Result Value Ref Range   Fecal Occult Bld NEGATIVE  NEGATIVE  CBC WITH DIFFERENTIAL     Status: Abnormal   Collection Time    12/15/13 10:45 PM      Result Value Ref Range   WBC 11.4 (*) 4.0 - 10.5 K/uL   RBC 6.08 (*) 3.87 - 5.11 MIL/uL   Hemoglobin 16.7 (*) 12.0 - 15.0 g/dL   HCT 50.8 (*) 36.0 - 46.0 %   MCV 83.6  78.0 - 100.0 fL   MCH 27.5  26.0 - 34.0 pg   MCHC 32.9  30.0 - 36.0 g/dL  RDW 15.4  11.5 - 15.5 %   Platelets 211  150 - 400 K/uL   Neutrophils Relative % 73  43 - 77 %   Neutro Abs 8.4 (*) 1.7 - 7.7 K/uL   Lymphocytes Relative 19  12 - 46 %   Lymphs Abs 2.1  0.7 - 4.0 K/uL   Monocytes Relative 7  3 - 12 %   Monocytes Absolute 0.8  0.1 - 1.0 K/uL   Eosinophils Relative 1  0 - 5 %   Eosinophils Absolute 0.1  0.0 - 0.7 K/uL   Basophils Relative 0  0 - 1 %   Basophils Absolute 0.0  0.0 - 0.1 K/uL  COMPREHENSIVE METABOLIC PANEL     Status: Abnormal   Collection Time    12/15/13 10:45 PM      Result Value Ref Range   Sodium 138  137 - 147 mEq/L   Potassium 3.7  3.7 - 5.3 mEq/L   Chloride 95 (*) 96 - 112 mEq/L   CO2 30  19 - 32 mEq/L   Glucose, Bld 135 (*) 70 - 99 mg/dL   BUN 11  6 - 23 mg/dL   Creatinine, Ser 0.74  0.50 - 1.10 mg/dL   Calcium 9.1  8.4 - 10.5 mg/dL   Total Protein 7.5  6.0 - 8.3 g/dL   Albumin 3.7  3.5 - 5.2 g/dL   AST 11  0 - 37 U/L   ALT 12  0 - 35 U/L   Alkaline Phosphatase 90  39 - 117 U/L   Total Bilirubin 0.8  0.3 - 1.2 mg/dL   GFR calc non Af Amer 87 (*) >90 mL/min   GFR calc Af Amer >90  >90 mL/min   Comment: (NOTE)     The eGFR has been calculated using the CKD EPI equation.     This calculation has not been validated in all clinical situations.     eGFR's persistently <90 mL/min signify possible Chronic Kidney     Disease.   Anion gap 13  5 - 15  LIPASE, BLOOD     Status: None   Collection Time    12/15/13 10:45 PM       Result Value Ref Range   Lipase 14  11 - 59 U/L    Radiology Reports: Ct Abdomen Pelvis W Contrast  12/15/2013   CLINICAL DATA:  Right upper quadrant pain, diarrhea  EXAM: CT ABDOMEN AND PELVIS WITH CONTRAST  TECHNIQUE: Multidetector CT imaging of the abdomen and pelvis was performed using the standard protocol following bolus administration of intravenous contrast.  CONTRAST:  129m OMNIPAQUE IOHEXOL 300 MG/ML  SOLN  COMPARISON:  None.  FINDINGS: Lung bases are clear.  Liver, spleen, pancreas, and adrenal glands are within normal limits.  Status post cholecystectomy. No intrahepatic or extrahepatic ductal dilatation.  Kidneys within normal limits.  No hydronephrosis.  No evidence of bowel obstruction. Prior appendectomy. Wall thickening involving distal small bowel/ ileum with associated mesenteric stranding (series 2/ image 65), suggesting infectious/inflammatory enteritis.  Atherosclerotic calcifications of the abdominal aorta and branch vessels.  Small volume pelvic ascites.  No drainable fluid collection/abscess.  No free air.  Status post hysterectomy.  No adnexal masses.  Bladder is within normal limits.  Mild degenerative changes of the lower thoracic spine.  IMPRESSION: Wall thickening involving distal small bowel/ileum, suggesting infectious/ inflammatory enteritis.  Associated small volume pelvic ascites. No drainable fluid collection/ abscess. No free air.  No evidence of bowel obstruction.  Electronically Signed   By: Julian Hy M.D.   On: 12/15/2013 23:34   Dg Chest Port 1 View  12/15/2013   CLINICAL DATA:  Chest pain and upper abdominal pain.  EXAM: PORTABLE CHEST - 1 VIEW  COMPARISON:  11/19/2013 and 05/18/2012  FINDINGS: Lungs are adequately inflated with bibasilar hazy density without significant change from the prior exams likely due to overlying soft tissues and vasculature. Stable borderline cardiomegaly. Remainder of the exam is unchanged.  IMPRESSION: No active disease.    Electronically Signed   By: Marin Olp M.D.   On: 12/15/2013 21:44    Electrocardiogram: Sinus rhythm at 80 beats a minute. Normal axis. Intervals are normal. No Q waves. No concerning ST or T-wave changes are noted.  Problem List  Principal Problem:   Enteritis Active Problems:   OBESITY   TOBACCO ABUSE   COPD   Barrett's esophagus   Assessment: This is a 63 year old, Caucasian female, with a history of COPD comes in with abdominal pain and diarrhea. She appears to have enteritis based on the CT scan. C difficile has been ruled out. She has abnormal, UA, but it appears to be a contaminated sample  Plan: #1 enteritis: Order GI pathogen panel. She will be placed on Cipro and Flagyl. This is probably infectious considering her history. She'll be given gentle IV hydration. She'll be kept on clear liquids for now. This can be advanced as tolerated.  #2 history of COPD on chronic home oxygen: Continue with her inhalers. Also, place her on albuterol nebulizers as she is wheezing currently. Chest x-ray does not show any active process.  #3 tobacco abuse: Nicotine patch will be prescribed.  #4 obesity: She will require weight loss counseling eventually.   DVT Prophylaxis: Lovenox Code Status: Full code Family Communication: Discussed with the patient  Disposition Plan: Admit to meds   Further management decisions will depend on results of further testing and patient's response to treatment.   [facility]  [facility] Pager [phone]  If 7PM-7AM, please contact night-coverage [email or web address] Password [email or web address]  12/16/2013, 12:50 AM  Disclaimer: This note was dictated with voice recognition software. Similar sounding words can inadvertently be transcribed and may not be corrected upon review.

## 2013-12-16 NOTE — Progress Notes (Addendum)
Patient ID: Aimee Mcclure, female   DOB: 04/11/1951, 63 y.o.   MRN: [id number] [facility] PROGRESS NOTE  Aimee Mcclure BJY:[id number] DOB: 1950-11-21 DOA: 12/15/2013 PCP: [name], DO  Brief narrative: 63 y.o. female with a past medical history of COPD, presented with right sided abdominal pain and multiple episodes of watery diarrhea that started one day PTA after eating burger at the [facility].  She uses oxygen at home for her COPD. She denies any nausea, vomiting. She's had upper endoscopy in 2007 which showed possible Barrett's esophagus.  Assessment and Plan:    Principal Problem:   Enteritis - pt reports feeling better this AM - tolerating clears well - continue Cipro and Flagyl day #2 - continue to provide analgesia and antiemetics as needed  Active Problems:   TOBACCO ABUSE - cessation consultation provided    COPD - appears to be clinically compensated at this time - pt maintaining oxygen saturations at target range - continue oxygen, BD's scheduled and as needed    Barrett's esophagus - continue PPI   DVT prophylaxis  Lovenox SQ while pt is in hospital  Code Status: Full Family Communication: Pt at bedside Disposition Plan: Home when medically stable   IV Access:   Peripheral IV Procedures and diagnostic studies:    Ct Abdomen Pelvis W Contrast  12/15/2013  Wall thickening involving distal small bowel/ileum, suggesting infectious/ inflammatory enteritis.  Associated small volume pelvic ascites. No drainable fluid collection/ abscess. No free air.  No evidence of bowel obstruction.    Dg Chest Port 1 View  12/15/2013  No active disease.  Medical Consultants:   None Other Consultants:   None Anti-Infectives:   Cipro 9/6 --> Flagyl 9/6 -->  Debbora Presto, MD  [facility] Pager [phone]  If 7PM-7AM, please contact night-coverage [email or web address] Password [email or web address] 12/16/2013, 1:22 PM   LOS: 1 day   HPI/Subjective: No events overnight.   Objective: Filed  Vitals:   12/16/13 0022 12/16/13 0118 12/16/13 0613 12/16/13 0744  BP: 106/69 161/70 121/50   Pulse: 88 81 78   Temp: 98.1 F (36.7 C) 97.5 F (36.4 C) 97.8 F (36.6 C)   TempSrc: Oral Oral Oral   Resp: Height:   (1.6 m)    Weight:  111.585 kg (246 lb)    SpO2: 94% 96% 94% 96%    Intake/Output Summary (Last 24 hours) at 12/16/13 1322 Last data filed at 12/16/13 0900  Gross per 24 hour  Intake    420 ml  Output    350 ml  Net     70 ml    Exam:   General:  Pt is alert, follows commands appropriately, not in acute distress  Cardiovascular: Regular rate and rhythm, S1/S2, no murmurs, no rubs, no gallops  Respiratory: Clear to auscultation bilaterally, no wheezing, no crackles, no rhonchi  Abdomen: Soft, tender in epigastric area, non distended, bowel sounds present, no guarding  Extremities: No edema, pulses DP and PT palpable bilaterally  Neuro: Grossly nonfocal  Data Reviewed: Basic Metabolic Panel:  Recent Labs Lab 12/15/13 2245 12/16/13 0645  NA 138 137  K 3.7 4.5  CL 95* 95*  CO2 30 32  GLUCOSE 135* 144*  BUN 11 11  CREATININE 0.74 0.60  CALCIUM 9.1 8.9   Liver Function Tests:  Recent Labs Lab 12/15/13 2245 12/16/13 0645  AST 11 10  ALT 12 11  ALKPHOS 90 91  BILITOT 0.8 0.4  PROT 7.5 7.6  ALBUMIN  3.7 3.4*    Recent Labs Lab 12/15/13 2245  LIPASE 14   CBC:  Recent Labs Lab 12/15/13 2245 12/16/13 0645  WBC 11.4* 9.8  NEUTROABS 8.4*  --   HGB 16.7* 16.2*  HCT 50.8* 51.4*  MCV 83.6 87.1  PLT 211 214     Recent Results (from the past 240 hour(s))  CLOSTRIDIUM DIFFICILE BY PCR     Status: None   Collection Time    12/15/13 10:19 PM      Result Value Ref Range Status   C difficile by pcr NEGATIVE  NEGATIVE Final   Comment: Performed at [facility]     Scheduled Meds: . albuterol  2.5 mg Nebulization Q6H  . arformoterol  15 mcg Nebulization BID  . budesonide  0.25 mg Nebulization BID  .  ciprofloxacin  400 mg Intravenous Q12H  . enoxaparin (LOVENOX) injection  55 mg Subcutaneous Daily  . ipratropium  0.5 mg Nebulization Q6H  . metronidazole  500 mg Intravenous Q8H  . nicotine  14 mg Transdermal Daily  . pantoprazole  40 mg Oral Daily  . simvastatin  10 mg Oral q1800   Continuous Infusions: . sodium chloride 75 mL/hr at 12/16/13 [phone]

## 2013-12-17 LAB — BASIC METABOLIC PANEL
Anion gap: 9 (ref 5–15)
BUN: 7 mg/dL (ref 6–23)
CALCIUM: 8.9 mg/dL (ref 8.4–10.5)
CO2: 33 mEq/L — ABNORMAL HIGH (ref 19–32)
Chloride: 95 mEq/L — ABNORMAL LOW (ref 96–112)
Creatinine, Ser: 0.66 mg/dL (ref 0.50–1.10)
Glucose, Bld: 102 mg/dL — ABNORMAL HIGH (ref 70–99)
POTASSIUM: 3.9 meq/L (ref 3.7–5.3)
Sodium: 137 mEq/L (ref 137–147)

## 2013-12-17 LAB — GI PATHOGEN PANEL BY PCR, STOOL
C difficile toxin A/B: NEGATIVE
CRYPTOSPORIDIUM BY PCR: NEGATIVE
Campylobacter by PCR: NEGATIVE
E COLI 0157 BY PCR: NEGATIVE
E coli (ETEC) LT/ST: NEGATIVE
E coli (STEC): NEGATIVE
G lamblia by PCR: NEGATIVE
NOROVIRUS G1/G2: NEGATIVE
Rotavirus A by PCR: NEGATIVE
Salmonella by PCR: NEGATIVE
Shigella by PCR: NEGATIVE

## 2013-12-17 LAB — CBC
HCT: 45.6 % (ref 36.0–46.0)
HEMOGLOBIN: 14.1 g/dL (ref 12.0–15.0)
MCH: 27.2 pg (ref 26.0–34.0)
MCHC: 30.9 g/dL (ref 30.0–36.0)
MCV: 87.9 fL (ref 78.0–100.0)
PLATELETS: 175 10*3/uL (ref 150–400)
RBC: 5.19 MIL/uL — ABNORMAL HIGH (ref 3.87–5.11)
RDW: 15.2 % (ref 11.5–15.5)
WBC: 8.5 10*3/uL (ref 4.0–10.5)

## 2013-12-17 MED ORDER — ALUM & MAG HYDROXIDE-SIMETH 200-200-20 MG/5ML PO SUSP: 30.0000 mL | Freq: Three times a day (TID) | ORAL | Status: DC | PRN

## 2013-12-17 MED ORDER — CIPROFLOXACIN HCL 500 MG PO TABS: 500.0000 mg | ORAL_TABLET | Freq: Two times a day (BID) | ORAL | Status: AC

## 2013-12-17 MED ORDER — OXYCODONE HCL 5 MG PO TABS: 5.0000 mg | ORAL_TABLET | ORAL | Status: AC | PRN

## 2013-12-17 MED ORDER — METRONIDAZOLE 500 MG PO TABS: 500.0000 mg | ORAL_TABLET | Freq: Three times a day (TID) | ORAL | Status: AC

## 2013-12-17 NOTE — Progress Notes (Signed)
Physical Therapy Treatment Patient Details Name: Aimee Mcclure MRN: [id number] DOB: 01-Sep-1950 Today's Date: 12/17/2013    History of Present Illness 63 yo female admitted with enteritis.     PT Comments    Progressing well with mobility. Able to ambulate and manage IV pole on today. Plans to d/c home later today. No further questions/concerns from pt.   Follow Up Recommendations  No PT follow up     Equipment Recommendations  None recommended by PT    Recommendations for Other Services       Precautions / Restrictions Restrictions Weight Bearing Restrictions: No    Mobility  Bed Mobility                  Transfers Overall transfer level: Modified independent                  Ambulation/Gait Ambulation/Gait assistance: Supervision Ambulation Distance (Feet): 135 Feet Assistive device:  (IV pole) Gait Pattern/deviations: Step-through pattern     General Gait Details: slow gait speed. tolerated well.    Stairs            Wheelchair Mobility    Modified Rankin (Stroke Patients Only)       Balance                                    Cognition Arousal/Alertness: Awake/alert Behavior During Therapy: WFL for tasks assessed/performed Overall Cognitive Status: Within Functional Limits for tasks assessed                      Exercises      General Comments        Pertinent Vitals/Pain Pain Assessment: No/denies pain    Home Living                      Prior Function            PT Goals (current goals can now be found in the care plan section) Progress towards PT goals: Progressing toward goals    Frequency       PT Plan Current plan remains appropriate    Co-evaluation             End of Session   Activity Tolerance: Patient tolerated treatment well Patient left: in bed;with call bell/phone within reach     Time: 1003-1015 PT Time Calculation (min): 12 min  Charges:  $Gait  Training: 8-22 mins                    G Codes:      Rebeca Alert, MPT Pager: [phone]

## 2013-12-17 NOTE — Progress Notes (Signed)
Nurse reviewed discharge instructions with pt.  Pt verbalized understanding of discharge instructions, follow up appointments and new medications.  No concerns at time of discharge. 

## 2013-12-17 NOTE — Evaluation (Signed)
Occupational Therapy Evaluation Patient Details Name: Aimee Mcclure MRN: [id number] DOB: 1950/05/05 Today's Date: 12/17/2013    History of Present Illness 63 yo female admitted with enteritis.    Clinical Impression   Pt to d/c today. She is doing well. Educated on energy conservation and need for rest breaks and have supervision with initial showers. Pt verbalized understanding of all recommendations. She has family that can check in and assist PRN.    Follow Up Recommendations  No OT follow up;Supervision - Intermittent    Equipment Recommendations  None recommended by OT    Recommendations for Other Services       Precautions / Restrictions Precautions Precautions: None Restrictions Weight Bearing Restrictions: No      Mobility Bed Mobility                  Transfers Overall transfer level: Modified independent   Transfers: Sit to/from Stand                Balance                                            ADL Overall ADL's : Needs assistance/impaired Eating/Feeding: Independent;Sitting   Grooming: Wash/dry hands;Supervision/safety;Standing   Upper Body Bathing: Set up;Sitting   Lower Body Bathing: Supervison/ safety;Sit to/from stand   Upper Body Dressing : Set up;Sitting   Lower Body Dressing: Supervision/safety;Sit to/from stand   Toilet Transfer: Supervision/safety;Ambulation;Comfort height toilet   Toileting- Clothing Manipulation and Hygiene: Supervision/safety;Sit to/from stand   Tub/ Shower Transfer: Tub transfer;Supervision/safety     General ADL Comments: Pt to d/c today. She practiced up to the bathroom for toilet transfer and step over like a tub. She bent over in standing to adjust her scrub pant bottoms and after all this activity she states she did feel some fatigue. Educated pt on sitting down to rest frequently/as needed. Pt verbalized understanding. She has a tub at home and advised  pt to have  supervision with initial showers and she states she can call her granddaughter to come over and help with showers initially. Pt doing well overall and anxious to d/c.      Vision                     Perception     Praxis      Pertinent Vitals/Pain Pain Assessment: No/denies pain     Hand Dominance     Extremity/Trunk Assessment Upper Extremity Assessment Upper Extremity Assessment: Defer to OT evaluation           Communication Communication Communication: No difficulties   Cognition Arousal/Alertness: Awake/alert Behavior During Therapy: WFL for tasks assessed/performed Overall Cognitive Status: Within Functional Limits for tasks assessed                     General Comments       Exercises       Shoulder Instructions      Home Living Family/patient expects to be discharged to:: Private residence Living Arrangements: Alone Available Help at Discharge: Family Type of Home: Apartment Home Access: Level entry     Home Layout: One level     Bathroom Shower/Tub: Chief Strategy Officer: Handicapped height     Home Equipment: None   Additional Comments: Pt states she is in process of moving in with  dtr.  Pt states she can borrow RW from friend      Prior Functioning/Environment Level of Independence: Independent             OT Diagnosis:     OT Problem List:     OT Treatment/Interventions:      OT Goals(Current goals can be found in the care plan section) Acute Rehab OT Goals Patient Stated Goal: home  OT Frequency:     Barriers to D/C:            Co-evaluation              End of Session    Activity Tolerance: Patient tolerated treatment well Patient left: with nursing/sitter in room   Time: 1105-1120 OT Time Calculation (min): 15 min Charges:  OT General Charges $OT Visit: 1 Procedure OT Evaluation $Initial OT Evaluation Tier I: 1 Procedure OT Treatments $Therapeutic Activity: 8-22  mins G-Codes:    Lennox Laity [phone] 12/17/2013, 12:33 PM

## 2013-12-17 NOTE — Discharge Instructions (Signed)

## 2013-12-17 NOTE — Discharge Summary (Signed)
Physician Discharge Summary  Aimee Mcclure ZOX:[id number] DOB: 05-Dec-1950 DOA: 12/15/2013  PCP: [name], DO  Admit date: 12/15/2013 Discharge date: 12/17/2013  Recommendations for Outpatient Follow-up:  1. Pt will need to follow up with PCP in 2-3 weeks post discharge 2. Please obtain BMP to evaluate electrolytes and kidney function 3. Please also check CBC to evaluate Hg and Hct levels 4. Continue Cipro and Flagyl for 12 more days post discharge   Discharge Diagnoses: Viral gastroenteritis Principal Problem:   Enteritis Active Problems:   OBESITY   TOBACCO ABUSE   COPD   Barrett's esophagus   Discharge Condition: Stable  Diet recommendation: Heart healthy diet discussed in details   Brief narrative:  63 y.o. female with a past medical history of COPD, presented with right sided abdominal pain and multiple episodes of watery diarrhea that started one day PTA after eating burger at the [facility]. She uses oxygen at home for her COPD. She denies any nausea, vomiting. She's had upper endoscopy in 2007 which showed possible Barrett's esophagus.   Assessment and Plan:   Principal Problem:  Enteritis  - pt reports feeling better this AM  - tolerating regular diet well and wants to go home  - continue Cipro and Flagyl day #2, transition to oral ABX upon discharge for 12 more days  - continue to provide analgesia and antiemetics as needed  Active Problems:  TOBACCO ABUSE  - cessation consultation provided  COPD  - appears to be clinically compensated at this time  - pt maintaining oxygen saturations at target range  - continue oxygen, BD's scheduled and as needed  Barrett's esophagus  - continue PPI   Code Status: Full  Family Communication: Pt at bedside  Disposition Plan: Home   IV Access:   Peripheral IV Procedures and diagnostic studies:   Ct Abdomen Pelvis W Contrast 12/15/2013 Wall thickening involving distal small bowel/ileum, suggesting infectious/ inflammatory  enteritis. Associated small volume pelvic ascites. No drainable fluid collection/ abscess. No free air. No evidence of bowel obstruction.  Dg Chest Port 1 View 12/15/2013 No active disease.  Medical Consultants:   None Other Consultants:   None Anti-Infectives:   Cipro 9/6 -->  Flagyl 9/6 -->  Discharge Exam: Filed Vitals:   12/17/13 0600  BP: 123/52  Pulse: 79  Temp: 98 F (36.7 C)  Resp: 18   Filed Vitals:   12/16/13 1400 12/16/13 1403 12/16/13 2200 12/17/13 0600  BP: 117/45  113/55 123/52  Pulse: 78  87 79  Temp: 97.9 F (36.6 C)  98.7 F (37.1 C) 98 F (36.7 C)  TempSrc: Oral  Oral Oral  Resp: Height:      Weight:      SpO2: 93% 96% 98% 95%    General: Pt is alert, follows commands appropriately, not in acute distress Cardiovascular: Regular rate and rhythm, S1/S2 +, no murmurs, no rubs, no gallops Respiratory: Clear to auscultation bilaterally, no wheezing, no crackles, no rhonchi Abdominal: Soft, non tender, non distended, bowel sounds +, no guarding Extremities: no edema, no cyanosis, pulses palpable bilaterally DP and PT Neuro: Grossly nonfocal  Discharge Instructions  Discharge Instructions   Diet - low sodium heart healthy    Complete by:  As directed      Increase activity slowly    Complete by:  As directed             Medication List         albuterol 108 (  90 BASE) MCG/ACT inhaler  Commonly known as:  PROVENTIL HFA;VENTOLIN HFA  Inhale 2 puffs into the lungs every 6 (six) hours as needed for wheezing or shortness of breath.     arformoterol 15 MCG/2ML Nebu  Commonly known as:  BROVANA  Take 2 mLs (15 mcg total) by nebulization 2 (two) times daily.     budesonide 0.25 MG/2ML nebulizer solution  Commonly known as:  PULMICORT  Take 2 mLs (0.25 mg total) by nebulization 2 (two) times daily.     cetirizine 10 MG tablet  Commonly known as:  ZYRTEC  Take 1 tablet (10 mg total) by mouth daily.     cholecalciferol 1000 UNITS tablet   Commonly known as:  VITAMIN D  Take 2,000 Units by mouth daily. PATIENT IS TAKING 2,000     ciprofloxacin 500 MG tablet  Commonly known as:  CIPRO  Take 1 tablet (500 mg total) by mouth 2 (two) times daily.     furosemide 20 MG tablet  Commonly known as:  LASIX  take 1 tablet by mouth once daily     metroNIDAZOLE 500 MG tablet  Commonly known as:  FLAGYL  Take 1 tablet (500 mg total) by mouth 3 (three) times daily.     NEXIUM 20 MG capsule  Generic drug:  esomeprazole  Take 1 capsule (20 mg total) by mouth daily at 12 noon.     oxyCODONE 5 MG immediate release tablet  Commonly known as:  Oxy IR/ROXICODONE  Take 1 tablet (5 mg total) by mouth every 4 (four) hours as needed for moderate pain.     pravastatin 20 MG tablet  Commonly known as:  PRAVACHOL  Take 1 tablet (20 mg total) by mouth daily.     VITAMIN B 12 PO  Take 1 tablet by mouth daily.           Follow-up Information   Schedule an appointment as soon as possible for a visit with [name], DO.   Specialty:  Internal Medicine   Contact information:   [address] [place] [address] [phone]       Follow up with Debbora Presto, MD. (As needed call my cell phone [phone])    Specialty:  Internal Medicine   Contact information:   [address] [place] [address] [phone]        The results of significant diagnostics from this hospitalization (including imaging, microbiology, ancillary and laboratory) are listed below for reference.     Microbiology: Recent Results (from the past 240 hour(s))  CLOSTRIDIUM DIFFICILE BY PCR     Status: None   Collection Time    12/15/13 10:19 PM      Result Value Ref Range Status   C difficile by pcr NEGATIVE  NEGATIVE Final   Comment: Performed at [facility]     Labs: Basic Metabolic Panel:  Recent Labs Lab 12/15/13 2245 12/16/13 0645 12/17/13 0010  NA 138 137 137  K 3.7 4.5 3.9  CL 95* 95* 95*   CO2 30 32 33*  GLUCOSE 135* 144* 102*  BUN CREATININE 0.74 0.60 0.66  CALCIUM 9.1 8.9 8.9   Liver Function Tests:  Recent Labs Lab 12/15/13 2245 12/16/13 0645  AST 11 10  ALT 12 11  ALKPHOS 90 91  BILITOT 0.8 0.4  PROT 7.5 7.6  ALBUMIN 3.7 3.4*    Recent Labs Lab 12/15/13 2245  LIPASE 14   No  results found for this basename: AMMONIA,  in the last 168 hours CBC:  Recent Labs Lab 12/15/13 2245 12/16/13 0645 12/17/13 0010  WBC 11.4* 9.8 8.5  NEUTROABS 8.4*  --   --   HGB 16.7* 16.2* 14.1  HCT 50.8* 51.4* 45.6  MCV 83.6 87.1 87.9  PLT 211 214 175   Cardiac Enzymes: No results found for this basename: CKTOTAL, CKMB, CKMBINDEX, TROPONINI,  in the last 168 hours BNP: BNP (last 3 results)  Recent Labs  09/18/13 1614  PROBNP 34.0   CBG: No results found for this basename: GLUCAP,  in the last 168 hours   SIGNED: Time coordinating discharge: Over 30 minutes  Debbora Presto, MD  [facility] 12/17/2013, 9:16 AM Pager [phone]  If 7PM-7AM, please contact night-coverage [email or web address] Password [email or web address]

## 2013-12-26 ENCOUNTER — Ambulatory Visit: Payer: Medicare Other | Admitting: Internal Medicine

## 2013-12-27 ENCOUNTER — Ambulatory Visit: Payer: Medicare Other | Admitting: Pulmonary Disease

## 2014-01-07 ENCOUNTER — Telehealth: Payer: Self-pay | Admitting: Internal Medicine

## 2014-01-07 NOTE — Telephone Encounter (Signed)
Pt called to ask if Dr Artist PaisYoo will rx her Xanax. She said she is trying to stop smoking and need something to help her with sleeping at night    [facility] [address]

## 2014-01-08 NOTE — Telephone Encounter (Signed)
She should consult her pulmonologist and make sure they think it is safe for her take xanax.  Xanax is not a smoking cessation medication.  Chantix and Wellbutrin can be used for smoking cessation.   She needs to either make appt with pulm [facility] to determine if she is candidate for medication.

## 2014-01-20 NOTE — Telephone Encounter (Signed)
Left message for pt to call back  °

## 2014-01-21 NOTE — Telephone Encounter (Signed)
Pt states she does not need for smoking cessation.  She would like xanax to calm her nerves.  Dr. Kendrick FriesMcQuaid, is it okay for pt to take xanax?

## 2014-01-21 NOTE — Telephone Encounter (Signed)
nope

## 2014-01-22 NOTE — Telephone Encounter (Signed)
Pt aware.

## 2014-01-22 NOTE — Telephone Encounter (Signed)
Inform pt her pulmonary physician recommends against using xanax.

## 2014-01-22 NOTE — Telephone Encounter (Signed)
See message below °

## 2014-02-04 ENCOUNTER — Encounter: Payer: Self-pay | Admitting: Internal Medicine

## 2014-04-17 ENCOUNTER — Other Ambulatory Visit: Payer: Self-pay | Admitting: Internal Medicine

## 2014-05-09 ENCOUNTER — Other Ambulatory Visit: Payer: Self-pay | Admitting: Internal Medicine

## 2014-08-27 ENCOUNTER — Telehealth: Payer: Self-pay

## 2014-08-27 NOTE — Telephone Encounter (Signed)
Unable to leave message. Number is incorrect.  Letter mailed

## 2014-10-01 IMAGING — CR DG CHEST 1V PORT
1 series · 2 of 2 positions shown · non-contrast
Comparison: 11/19/2013 and 05/18/2012

CLINICAL DATA: Chest pain and upper abdominal pain.

EXAM:
PORTABLE CHEST - 1 VIEW

[Series 1: AP · U · 2 of 2 slices shown]
[im 1/2]
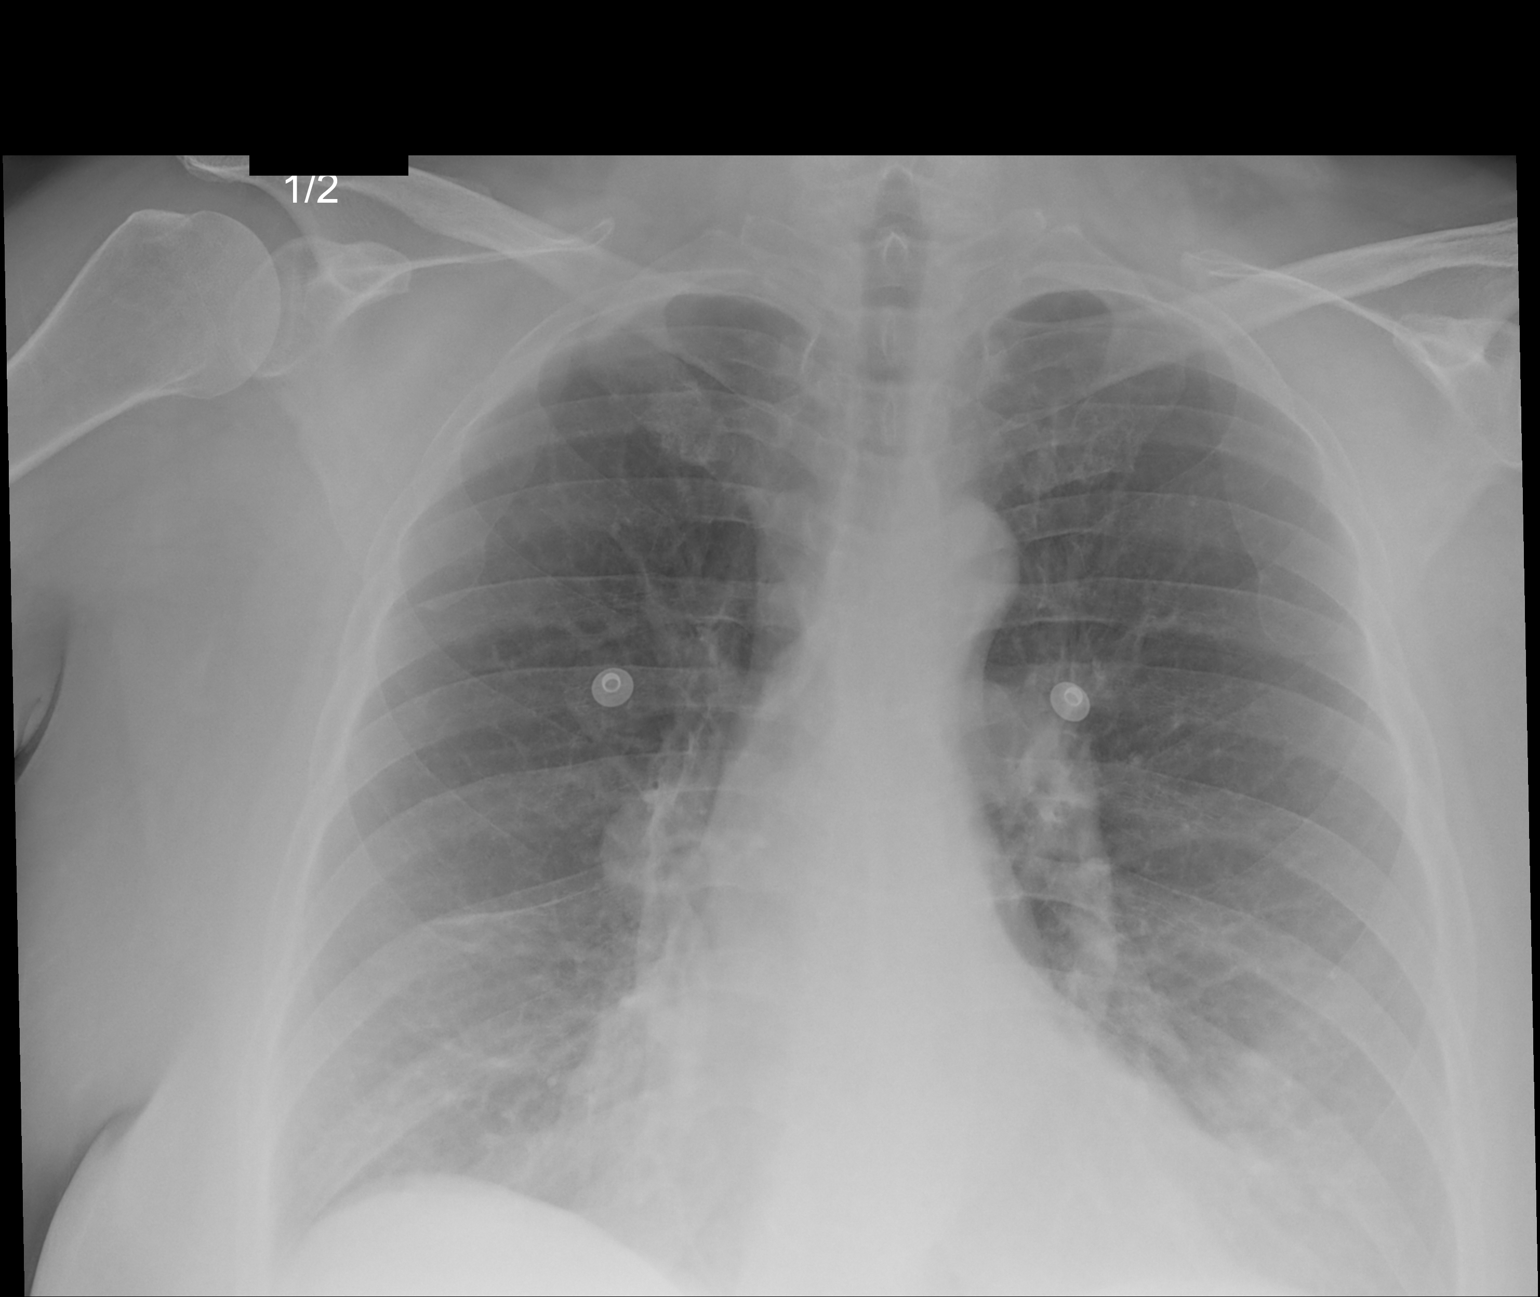
[im 2/2]
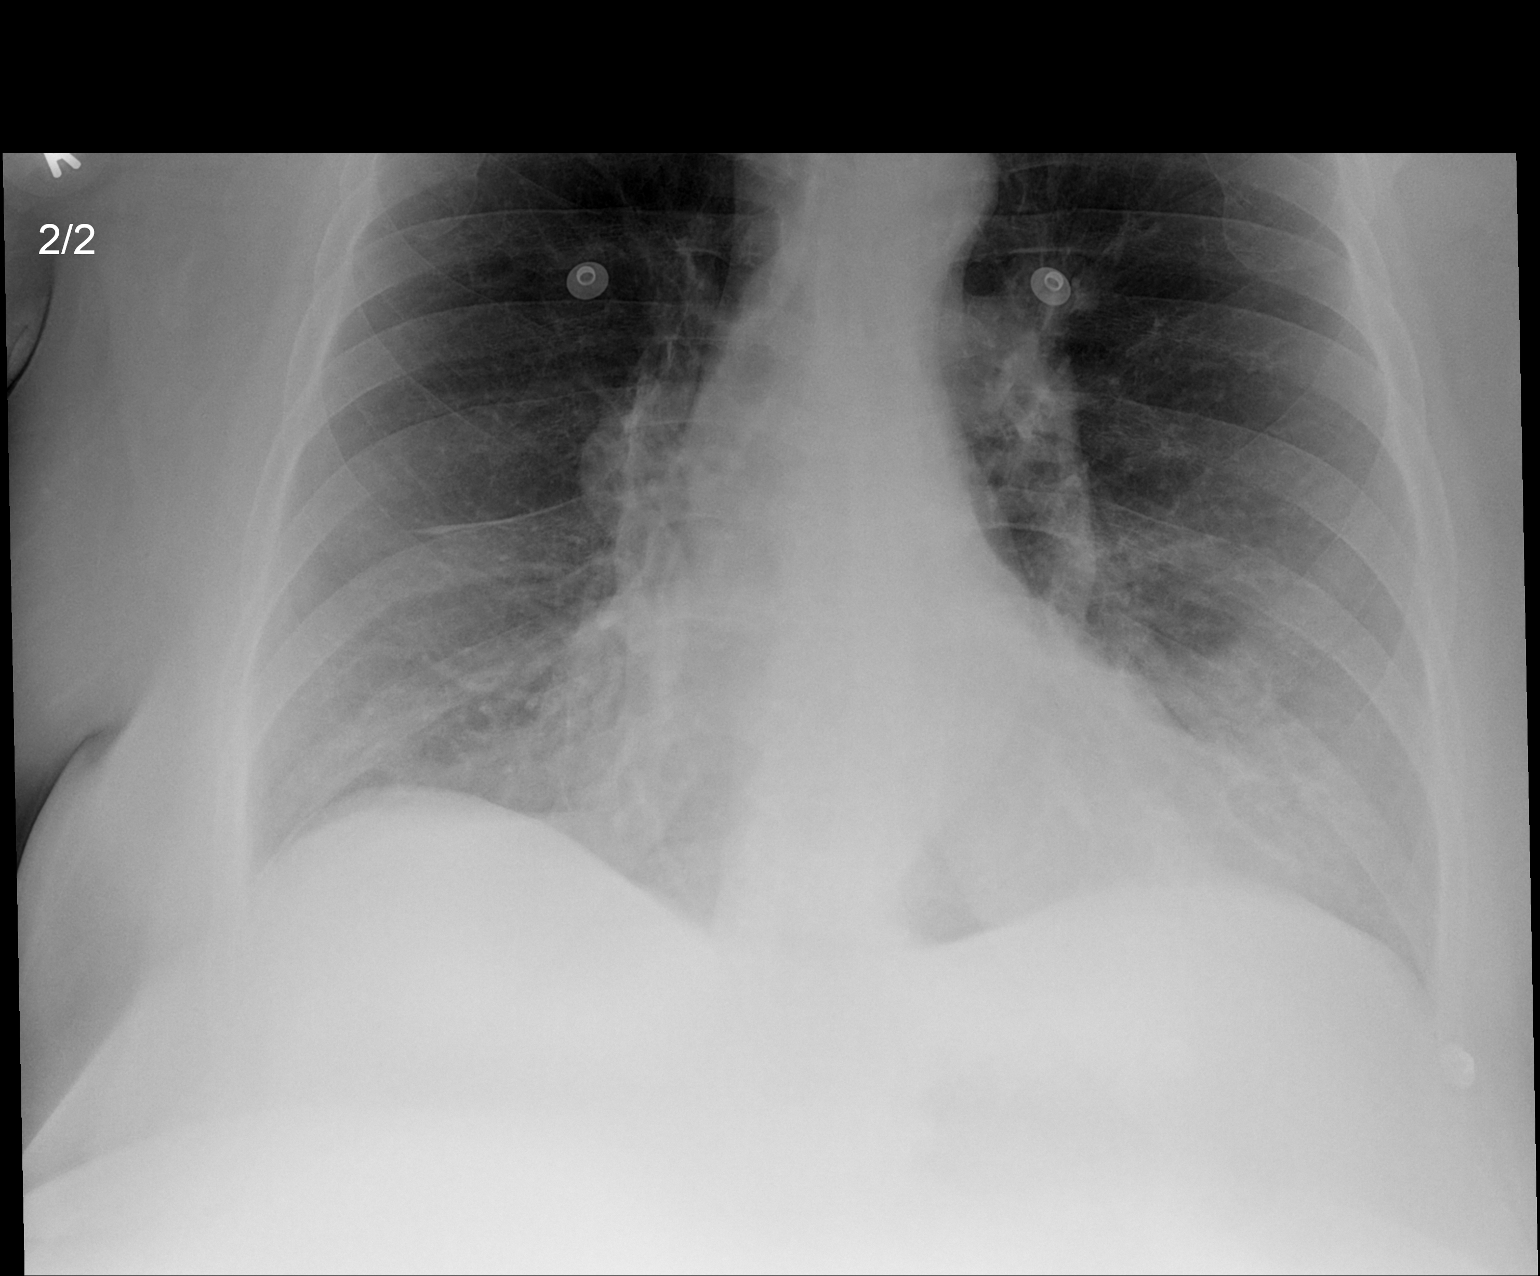

[2 of 2 positions shown; findings below may reference images not displayed]

FINDINGS: Lungs are adequately inflated with bibasilar hazy density without
significant change from the prior exams likely due to overlying soft
tissues and vasculature. Stable borderline cardiomegaly. Remainder
of the exam is unchanged.
IMPRESSION: No active disease.

## 2014-12-25 ENCOUNTER — Encounter: Payer: Self-pay | Admitting: Internal Medicine

## 2015-01-30 ENCOUNTER — Encounter: Payer: Self-pay | Admitting: Internal Medicine
